# Patient Record
Sex: Female | Born: 1955 | Race: White | Hispanic: No | Marital: Married | Smoking: Former smoker
Health system: Southern US, Community
[De-identification: ages and names within clinical notes are randomized; demographics above are authoritative.]

## PROBLEM LIST (undated history)

## (undated) DIAGNOSIS — M1990 Unspecified osteoarthritis, unspecified site: Secondary | ICD-10-CM

## (undated) DIAGNOSIS — Z972 Presence of dental prosthetic device (complete) (partial): Secondary | ICD-10-CM

## (undated) DIAGNOSIS — I10 Essential (primary) hypertension: Secondary | ICD-10-CM

## (undated) HISTORY — PX: HEMORROIDECTOMY: SUR656

## (undated) HISTORY — PX: REDUCTION MAMMAPLASTY: SUR839

---

## 1996-02-07 HISTORY — PX: BREAST REDUCTION SURGERY: SHX8

## 2003-12-01 ENCOUNTER — Ambulatory Visit: Payer: Self-pay | Admitting: Family Medicine

## 2003-12-08 ENCOUNTER — Ambulatory Visit: Payer: Self-pay | Admitting: Family Medicine

## 2003-12-30 ENCOUNTER — Ambulatory Visit: Payer: Self-pay | Admitting: General Surgery

## 2008-01-16 ENCOUNTER — Ambulatory Visit: Payer: Self-pay | Admitting: Family Medicine

## 2008-01-29 ENCOUNTER — Ambulatory Visit: Payer: Self-pay | Admitting: Family Medicine

## 2008-08-19 ENCOUNTER — Ambulatory Visit: Payer: Self-pay | Admitting: General Surgery

## 2008-09-14 ENCOUNTER — Encounter: Payer: Self-pay | Admitting: Urology

## 2008-10-07 ENCOUNTER — Encounter: Payer: Self-pay | Admitting: Urology

## 2008-11-06 ENCOUNTER — Encounter: Payer: Self-pay | Admitting: Urology

## 2009-02-09 ENCOUNTER — Ambulatory Visit: Payer: Self-pay | Admitting: General Surgery

## 2010-01-22 ENCOUNTER — Other Ambulatory Visit: Payer: Self-pay | Admitting: Family Medicine

## 2010-02-10 ENCOUNTER — Ambulatory Visit: Payer: Self-pay | Admitting: General Surgery

## 2011-03-28 ENCOUNTER — Ambulatory Visit: Payer: Self-pay | Admitting: General Surgery

## 2014-03-23 ENCOUNTER — Emergency Department: Payer: Self-pay | Admitting: Emergency Medicine

## 2014-04-29 DIAGNOSIS — D582 Other hemoglobinopathies: Secondary | ICD-10-CM | POA: Insufficient documentation

## 2014-04-29 DIAGNOSIS — J302 Other seasonal allergic rhinitis: Secondary | ICD-10-CM | POA: Insufficient documentation

## 2014-04-29 DIAGNOSIS — I10 Essential (primary) hypertension: Secondary | ICD-10-CM | POA: Insufficient documentation

## 2014-05-27 DIAGNOSIS — E785 Hyperlipidemia, unspecified: Secondary | ICD-10-CM | POA: Insufficient documentation

## 2014-05-29 ENCOUNTER — Ambulatory Visit
Admit: 2014-05-29 | Disposition: A | Discharge status: Home / Self Care | Payer: Self-pay | Attending: Internal Medicine | Admitting: Internal Medicine

## 2014-07-13 ENCOUNTER — Ambulatory Visit: Payer: No Typology Code available for payment source | Admitting: Anesthesiology

## 2014-07-13 ENCOUNTER — Ambulatory Visit
Admission: RE | Admit: 2014-07-13 | Discharge: 2014-07-13 | Disposition: A | Discharge status: Home / Self Care | Payer: No Typology Code available for payment source | Source: Ambulatory Visit | Attending: Gastroenterology | Admitting: Gastroenterology

## 2014-07-13 ENCOUNTER — Encounter: Payer: Self-pay | Admitting: Anesthesiology

## 2014-07-13 ENCOUNTER — Encounter
Admission: RE | Disposition: A | Discharge status: Home / Self Care | Payer: Self-pay | Source: Ambulatory Visit | Attending: Gastroenterology

## 2014-07-13 DIAGNOSIS — Z79899 Other long term (current) drug therapy: Secondary | ICD-10-CM | POA: Insufficient documentation

## 2014-07-13 DIAGNOSIS — F17210 Nicotine dependence, cigarettes, uncomplicated: Secondary | ICD-10-CM | POA: Insufficient documentation

## 2014-07-13 DIAGNOSIS — Z1211 Encounter for screening for malignant neoplasm of colon: Secondary | ICD-10-CM | POA: Diagnosis present

## 2014-07-13 DIAGNOSIS — E780 Pure hypercholesterolemia: Secondary | ICD-10-CM | POA: Diagnosis not present

## 2014-07-13 DIAGNOSIS — D123 Benign neoplasm of transverse colon: Secondary | ICD-10-CM | POA: Insufficient documentation

## 2014-07-13 DIAGNOSIS — I10 Essential (primary) hypertension: Secondary | ICD-10-CM | POA: Diagnosis not present

## 2014-07-13 DIAGNOSIS — K648 Other hemorrhoids: Secondary | ICD-10-CM | POA: Insufficient documentation

## 2014-07-13 HISTORY — PX: COLONOSCOPY WITH PROPOFOL: SHX5780

## 2014-07-13 SURGERY — COLONOSCOPY WITH PROPOFOL
Anesthesia: General

## 2014-07-13 MED ORDER — PHENYLEPHRINE HCL 10 MG/ML IJ SOLN
INTRAMUSCULAR | Status: DC | PRN
  Administered 2014-07-13 (×2): 100 ug via INTRAVENOUS

## 2014-07-13 MED ORDER — LIDOCAINE HCL (CARDIAC) 20 MG/ML IV SOLN
INTRAVENOUS | Status: DC | PRN
  Administered 2014-07-13: 60 mg via INTRAVENOUS

## 2014-07-13 MED ORDER — PROPOFOL 10 MG/ML IV BOLUS
INTRAVENOUS | Status: DC | PRN
  Administered 2014-07-13: 50 mg via INTRAVENOUS

## 2014-07-13 MED ORDER — PROPOFOL INFUSION 10 MG/ML OPTIME
INTRAVENOUS | Status: DC | PRN
  Administered 2014-07-13: 140 ug/kg/min via INTRAVENOUS

## 2014-07-13 MED ORDER — SODIUM CHLORIDE 0.9 % IV SOLN
INTRAVENOUS | Status: DC
  Administered 2014-07-13: 10:00:00 via INTRAVENOUS

## 2014-07-13 NOTE — Transfer of Care (Signed)
Immediate Anesthesia Transfer of Care Note  Patient: Kim Ritter  Procedure(s) Performed: Procedure(s): COLONOSCOPY WITH PROPOFOL (N/A)  Patient Location: endoscopy unit  Anesthesia Type:General  Level of Consciousness: awake, alert , oriented and patient cooperative  Airway & Oxygen Therapy: Patient Spontanous Breathing and Patient connected to nasal cannula oxygen  Post-op Assessment: Report given to RN, Post -op Vital signs reviewed and stable and Patient moving all extremities X 4  Post vital signs: Reviewed and stable  Last Vitals:  Filed Vitals:   07/13/14 1026  BP: 108/57  Pulse: 71  Temp: 35.6 C  Resp: 10    Complications: No apparent anesthesia complications

## 2014-07-13 NOTE — Op Note (Signed)
[facility] Gastroenterology Patient Name: Kim Ritter Procedure Date: 07/13/2014 9:48 AM MRN: [id number] Account #: [id number] Date of Birth: Dec 06, 1955 Admit Type: Outpatient Age: 59 Room: [facility] ENDO ROOM 2 Gender: Female Note Status: Finalized Procedure:         Colonoscopy Indications:       Screening for colorectal malignant neoplasm, Last                     colonoscopy 10 years ago Patient Profile:   This is a 59 year old female. Providers:         Gerrit Heck. Rayann Heman, MD Referring MD:      Glendon Axe (Referring MD) Medicines:         Propofol per Anesthesia Complications:     No immediate complications. Procedure:         Pre-Anesthesia Assessment:                    - Prior to the procedure, a History and Physical was                     performed, and patient medications and allergies were                     reviewed. The patient is competent. The risks and benefits                     of the procedure and the sedation options and risks were                     discussed with the patient. All questions were answered                     and informed consent was obtained. Patient identification                     and proposed procedure were verified by the physician and                     the nurse in the pre-procedure area. Mental Status                     Examination: alert and oriented. Airway Examination:                     normal oropharyngeal airway and neck mobility. Respiratory                     Examination: clear to auscultation. CV Examination: RRR,                     no murmurs, no S3 or S4. Prophylactic Antibiotics: The                     patient does not require prophylactic antibiotics. Prior                     Anticoagulants: The patient has taken no previous                     anticoagulant or antiplatelet agents. ASA Grade                     Assessment: II - A patient with mild systemic disease.  After  reviewing the risks and benefits, the patient was                     deemed in satisfactory condition to undergo the procedure.                     The anesthesia plan was to use monitored anesthesia care                     (MAC). Immediately prior to administration of medications,                     the patient was re-assessed for adequacy to receive                     sedatives. The heart rate, respiratory rate, oxygen                     saturations, blood pressure, adequacy of pulmonary                     ventilation, and response to care were monitored                     throughout the procedure. The physical status of the                     patient was re-assessed after the procedure.                    - Prior to the procedure, a History and Physical was                     performed, and patient medications, allergies and                     sensitivities were reviewed. The patient's tolerance of                     previous anesthesia was reviewed.                    After obtaining informed consent, the colonoscope was                     passed under direct vision. Throughout the procedure, the                     patient's blood pressure, pulse, and oxygen saturations                     were monitored continuously. The Colonoscope was                     introduced through the anus and advanced to the the cecum,                     identified by appendiceal orifice and ileocecal valve. The                     colonoscopy was performed without difficulty. The patient                     tolerated the procedure well. The quality of the bowel  preparation was excellent. Findings:      The perianal exam findings include non-thrombosed external hemorrhoids.      A 10 mm polyp was found in the proximal transverse colon. The polyp was       flat. The polyp was removed with a hot snare. The polyp was removed with       a saline injection-lift technique using  a hot snare. Resection and       retrieval were complete.      A 6 mm polyp was found in the mid transverse colon. The polyp was flat.       The polyp was removed with a hot snare. Resection and retrieval were       complete.      Four sessile polyps were found in the transverse colon. The polyps were       2 to 3 mm in size. These polyps were removed with a jumbo cold forceps.       Resection and retrieval were complete.      A 6 mm polyp was found in the distal transverse colon. The polyp was       flat. The polyp was removed with a hot snare. Resection and retrieval       were complete.      Internal hemorrhoids were found during retroflexion. The hemorrhoids       were Grade I (internal hemorrhoids that do not prolapse).      No additional abnormalities were found on retroflexion. Impression:        - Non-thrombosed external hemorrhoids found on perianal                     exam.                    - One 10 mm polyp in the proximal transverse colon.                     Resected and retrieved.                    - One 6 mm polyp in the transverse colon. Resected and                     retrieved.                    - Four 2 to 3 mm polyps in the transverse colon. Resected                     and retrieved.                    - One 6 mm polyp in the distal transverse colon. Resected                     and retrieved.                    - Internal hemorrhoids. Recommendation:    - Observe patient in GI recovery unit.                    - High fiber diet.                    - Continue present medications.                    -  Await pathology results.                    - Repeat colonoscopy for surveillance based on pathology                     results.                    - Return to referring physician.                    - The findings and recommendations were discussed with the                     patient.                    - The findings and recommendations were discussed with  the                     patient's family. Procedure Code(s): --- Professional ---                    [phone], 52, Colonoscopy, flexible; with endoscopic mucosal                     resection                    [phone], Colonoscopy, flexible; with removal of tumor(s),                     polyp(s), or other lesion(s) by snare technique                    [id number], 18, Colonoscopy, flexible; with biopsy, single or                     multiple CPT copyright 2014 American Medical Association. All rights reserved. The codes documented in this report are preliminary and upon coder review may  be revised to meet current compliance requirements. Mellody Life, MD 07/13/2014 10:21:48 AM This report has been signed electronically. Number of Addenda: 0 Note Initiated On: 07/13/2014 9:48 AM Scope Withdrawal Time: 0 hours 13 minutes 55 seconds  Total Procedure Duration: 0 hours 22 minutes 28 seconds       [facility]

## 2014-07-13 NOTE — Anesthesia Postprocedure Evaluation (Signed)
  Anesthesia Post-op Note  Patient: Kim Ritter  Procedure(s) Performed: Procedure(s): COLONOSCOPY WITH PROPOFOL (N/A)  Anesthesia type:General  Patient location: PACU  Post pain: Pain level controlled  Post assessment: Post-op Vital signs reviewed, Patient's Cardiovascular Status Stable, Respiratory Function Stable, Patent Airway and No signs of Nausea or vomiting  Post vital signs: Reviewed and stable  Last Vitals:  Filed Vitals:   07/13/14 1100  BP: 157/91  Pulse: 71  Temp:   Resp: 15    Level of consciousness: awake, alert  and patient cooperative  Complications: No apparent anesthesia complications

## 2014-07-13 NOTE — Discharge Instructions (Signed)

## 2014-07-13 NOTE — Anesthesia Preprocedure Evaluation (Signed)
Anesthesia Evaluation  Patient identified by MRN, date of birth, ID band Patient awake    Reviewed: Allergy & Precautions, Patient's Chart, lab work & pertinent test results  Airway Mallampati: II  TM Distance: >3 FB     Dental  (+) Chipped   Pulmonary          Cardiovascular     Neuro/Psych    GI/Hepatic   Endo/Other    Renal/GU      Musculoskeletal   Abdominal   Peds  Hematology   Anesthesia Other Findings   Reproductive/Obstetrics                             Anesthesia Physical Anesthesia Plan  ASA: II  Anesthesia Plan:    Post-op Pain Management:    Induction: Intravenous  Airway Management Planned: Nasal Cannula  Additional Equipment:   Intra-op Plan:   Post-operative Plan:   Informed Consent: I have reviewed the patients History and Physical, chart, labs and discussed the procedure including the risks, benefits and alternatives for the proposed anesthesia with the patient or authorized representative who has indicated his/her understanding and acceptance.     Plan Discussed with: CRNA  Anesthesia Plan Comments:         Anesthesia Quick Evaluation

## 2014-07-13 NOTE — H&P (Signed)
  Primary Care Physician:  Glendon Axe, MD  Pre-Procedure History & Physical: HPI:  Kim Ritter is a 59 y.o. female is here for an colonoscopy.   History reviewed. No pertinent past medical history.  History reviewed. No pertinent past surgical history.  Prior to Admission medications   Medication Sig Start Date End Date Taking? Authorizing Provider  amLODipine (NORVASC) 5 MG tablet Take 5 mg by mouth daily.   Yes Historical Provider, MD  Multiple Vitamin (MULTIVITAMIN WITH MINERALS) TABS tablet Take 1 tablet by mouth daily.    Historical Provider, MD    Allergies as of 06/15/2014  . (Not on File)    History reviewed. No pertinent family history.  History   Social History  . Marital Status: Married    Spouse Name: N/A  . Number of Children: N/A  . Years of Education: N/A   Occupational History  . Not on file.   Social History Main Topics  . Smoking status: Not on file  . Smokeless tobacco: Not on file  . Alcohol Use: Not on file  . Drug Use: Not on file  . Sexual Activity: Not on file   Other Topics Concern  . Not on file   Social History Narrative  . No narrative on file     Physical Exam: BP 150/79 mmHg  Pulse 73  Temp(Src) 97.1 F (36.2 C) (Tympanic)  Resp 20  Ht 5\' 2"  (1.575 m)  Wt 168 lb (76.204 kg)  BMI 30.72 kg/m2  SpO2 99% General:   Alert,  pleasant and cooperative in NAD Head:  Normocephalic and atraumatic. Neck:  Supple; no masses or thyromegaly. Lungs:  Clear throughout to auscultation.    Heart:  Regular rate and rhythm. Abdomen:  Soft, nontender and nondistended. Normal bowel sounds, without guarding, and without rebound.   Neurologic:  Alert and  oriented x4;  grossly normal neurologically.  Impression/Plan: Kim Ritter is here for an colonoscopy to be performed for screening, average risk  Risks, benefits, limitations, and alternatives regarding  colonoscopy have been reviewed with the patient.  Questions have been  answered.  All parties agreeable.   Josefine Class, MD  07/13/2014, 9:44 AM

## 2014-07-14 LAB — SURGICAL PATHOLOGY

## 2014-07-16 ENCOUNTER — Encounter: Payer: Self-pay | Admitting: Gastroenterology

## 2016-04-14 ENCOUNTER — Telehealth: Payer: Self-pay

## 2016-04-14 NOTE — Telephone Encounter (Signed)
Do you know this patient and why she left? Seems to have left before Dr. Venia Minks left.

## 2016-04-14 NOTE — Telephone Encounter (Signed)
Patient called and wanted to see if she could re establish with our practice she use to see Dr Venia Minks LOV 12/2011 and then she went to [facility] and was seen Dr Candiss Norse but states she just does not like [facility] and wanted to switch back. No major issues per patient. HTN. Would like to get a CPE. Are you willing to see her? She is aware that new MD is coming in August and could transfer over to her at that time if need to. -aa

## 2016-08-04 ENCOUNTER — Telehealth: Payer: Self-pay | Admitting: Physician Assistant

## 2016-08-04 NOTE — Telephone Encounter (Signed)
Patient came in asking if we were going let her re-establish care here.  She has been taking of her parents who are battling cancer and has not been taking care of herself.  She has not seen other providers.    But she recently lost her mom and wants to start coming back for medical care.

## 2016-09-15 ENCOUNTER — Ambulatory Visit (INDEPENDENT_AMBULATORY_CARE_PROVIDER_SITE_OTHER): Payer: Managed Care, Other (non HMO) | Admitting: Family Medicine

## 2016-09-15 ENCOUNTER — Encounter: Payer: Self-pay | Admitting: Family Medicine

## 2016-09-15 VITALS — BP 192/98 | HR 80 | Temp 98.2°F | Resp 16 | Ht 62.0 in | Wt 167.0 lb

## 2016-09-15 DIAGNOSIS — Z1231 Encounter for screening mammogram for malignant neoplasm of breast: Secondary | ICD-10-CM

## 2016-09-15 DIAGNOSIS — E669 Obesity, unspecified: Secondary | ICD-10-CM | POA: Insufficient documentation

## 2016-09-15 DIAGNOSIS — Z0000 Encounter for general adult medical examination without abnormal findings: Secondary | ICD-10-CM | POA: Insufficient documentation

## 2016-09-15 DIAGNOSIS — Z6830 Body mass index (BMI) 30.0-30.9, adult: Secondary | ICD-10-CM | POA: Diagnosis not present

## 2016-09-15 DIAGNOSIS — I10 Essential (primary) hypertension: Secondary | ICD-10-CM | POA: Insufficient documentation

## 2016-09-15 DIAGNOSIS — Z114 Encounter for screening for human immunodeficiency virus [HIV]: Secondary | ICD-10-CM | POA: Diagnosis not present

## 2016-09-15 DIAGNOSIS — Z124 Encounter for screening for malignant neoplasm of cervix: Secondary | ICD-10-CM

## 2016-09-15 DIAGNOSIS — Z1159 Encounter for screening for other viral diseases: Secondary | ICD-10-CM

## 2016-09-15 MED ORDER — AMLODIPINE BESYLATE 5 MG PO TABS: 5.0000 mg | ORAL_TABLET | Freq: Every day | ORAL | 1 refills | Status: DC

## 2016-09-15 NOTE — Assessment & Plan Note (Signed)
Discussed diet and exercise Screening lipid panel and A1c

## 2016-09-15 NOTE — Assessment & Plan Note (Signed)
Poorly controlled, but out of medication x1 month No red flags Resume Amlodipine Check CMP, screening lipid panel, and A1c F/u in 2wks Symptoms to seek emergency care for discussed

## 2016-09-15 NOTE — Patient Instructions (Signed)

## 2016-09-15 NOTE — Progress Notes (Signed)
Patient: Kim Ritter, Female    DOB: 01/29/1956, 61 y.o.   MRN: [id number] Visit Date: 09/15/2016  Today's Provider: Lavon Paganini, MD   Chief Complaint  Patient presents with  . Annual Exam  . Hypertension   Subjective:    Annual physical exam Kim Ritter is a 61 y.o. female who presents today for health maintenance and complete physical. She feels fairly well. Her mother passed away in 08-14-2022 of this year. She reports she is not regularly exercising, but she is active at work. She reports she is sleeping fairly well.   Last CPE- 01/17/2010 (Last office visit was 12/12/2011) Last colonoscopy- 07/13/2014- internal hemorrhoids, non-thrombosed external hemorrhoids. Sessile serrated adenoma and hyperplastic polyps.  Last mammogram- 05/29/2014- BI-RADS 1 Last pap- 01/17/2010- Negative. HPV negative. No h/o abnormal pap smears -----------------------------------------------------------------  Hypertension, follow-up:  BP Readings from Last 3 Encounters:  09/15/16 (!) 192/98  07/13/14 (!) 157/91    Pt was started on Amlodipine in 2016 at that the hospital. She was on Lisinopril-HCTZ, but it caused her to feel like "I had an elephant sitting on my chest". She reports fair compliance with treatment. She has been out of her Amlodipine for about 1 month. She is not having side effects.  She is not exercising. She is mostly adherent to low salt diet.   Outside blood pressures are 160-170/84-95. She is experiencing none.  Patient denies chest pain, chest pressure/discomfort, claudication, dyspnea, exertional chest pressure/discomfort, fatigue, irregular heart beat, lower extremity edema, near-syncope, orthopnea, palpitations and syncope.   Cardiovascular risk factors include hypertension and smoking/ tobacco exposure.  Use of agents associated with hypertension: none.     Weight trend: stable Wt Readings from Last 3 Encounters:  09/15/16 167 lb (75.8 kg)  07/13/14  168 lb (76.2 kg)    Current diet: in general, a "healthy" diet    ------------------------------------------------------------------------    Review of Systems  Constitutional: Negative.   HENT: Negative.   Eyes: Positive for redness. Negative for photophobia, pain, discharge, itching and visual disturbance.  Respiratory: Negative.   Cardiovascular: Negative.   Gastrointestinal: Negative.   Endocrine: Positive for polyuria. Negative for cold intolerance, heat intolerance, polydipsia and polyphagia.  Genitourinary: Positive for frequency. Negative for decreased urine volume, difficulty urinating, dyspareunia, dysuria, enuresis, flank pain, genital sores, hematuria, menstrual problem, pelvic pain, urgency, vaginal bleeding, vaginal discharge and vaginal pain.  Musculoskeletal: Negative.   Skin: Negative.   Allergic/Immunologic: Negative.   Neurological: Negative.   Hematological: Negative.   Psychiatric/Behavioral: Negative.     Social History      She  reports that she has been smoking Cigarettes.  She has a 25.00 pack-year smoking history. She has never used smokeless tobacco. She reports that she does not drink alcohol or use drugs.       Social History   Social History  . Marital status: Married    Spouse name: Legrand Como  . Number of children: 2  . Years of education: HS   Occupational History  .  Self Employed   Social History Main Topics  . Smoking status: Current Every Day Smoker    Packs/day: 1.00    Years: 25.00    Types: Cigarettes  . Smokeless tobacco: Never Used  . Alcohol use No  . Drug use: No  . Sexual activity: Yes    Birth control/ protection: Post-menopausal   Other Topics Concern  . None   Social History Narrative  . None    History  reviewed. No pertinent past medical history.   Patient Active Problem List   Diagnosis Date Noted  . Healthcare maintenance 09/15/2016  . Obesity 09/15/2016  . HTN (hypertension) 09/15/2016    Past  Surgical History:  Procedure Laterality Date  . BREAST REDUCTION SURGERY Bilateral 1998  . COLONOSCOPY WITH PROPOFOL N/A 07/13/2014   Procedure: COLONOSCOPY WITH PROPOFOL;  Surgeon: Josefine Class, MD;  Location: [facility] ENDOSCOPY;  Service: Endoscopy;  Laterality: N/A;  . HEMORROIDECTOMY      Family History        Family Status  Relation Status  . Mother Deceased  . Father Other       UNKNOWN        Her family history includes Congestive Heart Failure in her mother; Kidney failure in her mother; Melanoma in her mother.     No Known Allergies   Current Outpatient Prescriptions:  Marland Kitchen  Multiple Vitamin (MULTIVITAMIN WITH MINERALS) TABS tablet, Take 1 tablet by mouth daily., Disp: , Rfl:  .  amLODipine (NORVASC) 5 MG tablet, Take 1 tablet (5 mg total) by mouth daily., Disp: 30 tablet, Rfl: 1   Patient Care Team: Virginia Crews, MD as PCP - General (Family Medicine)      Objective:   Vitals: BP (!) 192/98 (BP Location: Left Arm, Patient Position: Sitting, Cuff Size: Normal)   Pulse 80   Temp 98.2 F (36.8 C) (Oral)   Resp 16   Ht 5\' 2"  (1.575 m)   Wt 167 lb (75.8 kg)   BMI 30.54 kg/m    Vitals:   09/15/16 1426  BP: (!) 192/98  Pulse: 80  Resp: 16  Temp: 98.2 F (36.8 C)  TempSrc: Oral  Weight: 167 lb (75.8 kg)  Height: 5\' 2"  (1.575 m)     Physical Exam  Constitutional: She is oriented to person, place, and time. She appears well-developed and well-nourished. No distress.  HENT:  Head: Normocephalic and atraumatic.  Right Ear: External ear normal.  Left Ear: External ear normal.  Mouth/Throat: Oropharynx is clear and moist. No oropharyngeal exudate.  Eyes: Pupils are equal, round, and reactive to light. Conjunctivae and EOM are normal. No scleral icterus.  Neck: Neck supple. No JVD present.  Cardiovascular: Normal rate, regular rhythm, normal heart sounds and intact distal pulses.   No murmur heard. Pulmonary/Chest: Effort normal and breath sounds  normal. No respiratory distress. She has no wheezes. She has no rales.  Abdominal: Soft. Bowel sounds are normal. She exhibits no distension. There is no tenderness. There is no rebound and no guarding.  Genitourinary:  Genitourinary Comments: Breasts: breasts appear normal, no suspicious masses, no skin or nipple changes or axillary nodes, surgical scars noted from previous breast reduction. GYN:  External genitalia within normal limits.  Vaginal mucosa pink, moist, normal rugae.  Nonfriable cervix without lesions, no discharge or bleeding noted on speculum exam.  Bimanual exam revealed normal, nongravid uterus.  No cervical motion tenderness. No adnexal masses bilaterally.     Musculoskeletal: She exhibits no edema or deformity.  Lymphadenopathy:    She has no cervical adenopathy.  Neurological: She is alert and oriented to person, place, and time. No cranial nerve deficit.  Skin: Skin is warm and dry. No rash noted.  Psychiatric: She has a normal mood and affect. Her behavior is normal. Thought content normal.  Vitals reviewed.    Depression Screen PHQ 2/9 Scores 09/15/2016  PHQ - 2 Score 2      Assessment & Plan:  Routine Health Maintenance and Physical Exam  Exercise Activities and Dietary recommendations Goals    None       There is no immunization history on file for this patient.  Health Maintenance  Topic Date Due  . Hepatitis C Screening  1955/04/12  . HIV Screening  11/30/1969  . TETANUS/TDAP  11/30/1973  . PAP SMEAR  12/01/1975  . MAMMOGRAM  05/28/2016  . INFLUENZA VACCINE  09/06/2016  . COLONOSCOPY  07/12/2024     Discussed health benefits of physical activity, and encouraged her to engage in regular exercise appropriate for her age and condition.   HTN (hypertension) Poorly controlled, but out of medication x1 month No red flags Resume Amlodipine Check CMP, screening lipid panel, and A1c F/u in 2wks Symptoms to seek emergency care for  discussed  Obesity Discussed diet and exercise Screening lipid panel and A1c  Healthcare maintenance Breast exam done today Pap smear collected UTD on colon cancer screening Screening Hep C and HIV Next CPE in 1 yr    --------------------------------------------------------------------  The entirety of the information documented in the History of Present Illness, Review of Systems and Physical Exam were personally obtained by me. Portions of this information were initially documented by Raquel Sarna Ratchford, CMA and reviewed by me for thoroughness and accuracy.    Lavon Paganini, MD  [facility]

## 2016-09-15 NOTE — Assessment & Plan Note (Addendum)
Breast exam done today Pap smear collected UTD on colon cancer screening Screening Hep C and HIV Next CPE in 1 yr

## 2016-09-18 ENCOUNTER — Encounter: Payer: Self-pay | Admitting: Family Medicine

## 2016-09-18 ENCOUNTER — Telehealth: Payer: Self-pay | Admitting: Family Medicine

## 2016-09-18 LAB — PAP IG AND HPV HIGH-RISK
HPV, HIGH-RISK: NEGATIVE
PAP SMEAR COMMENT: 0

## 2016-09-18 NOTE — Telephone Encounter (Signed)
Pt is returning call.  GB#[phone]/YE

## 2016-09-19 NOTE — Telephone Encounter (Signed)
Pt advised of lab results. See results in charts.

## 2016-09-21 ENCOUNTER — Encounter: Payer: Self-pay | Admitting: Family Medicine

## 2016-09-25 ENCOUNTER — Encounter: Payer: Self-pay | Admitting: Family Medicine

## 2016-09-26 ENCOUNTER — Telehealth: Payer: Self-pay

## 2016-09-26 LAB — HIV ANTIBODY (ROUTINE TESTING W REFLEX): HIV Screen 4th Generation wRfx: NONREACTIVE

## 2016-09-26 LAB — COMPREHENSIVE METABOLIC PANEL
ALT: 23 IU/L (ref 0–32)
AST: 23 IU/L (ref 0–40)
Albumin/Globulin Ratio: 1.6 (ref 1.2–2.2)
Albumin: 4.5 g/dL (ref 3.6–4.8)
Alkaline Phosphatase: 106 IU/L (ref 39–117)
BUN / CREAT RATIO: 12 (ref 12–28)
BUN: 9 mg/dL (ref 8–27)
Bilirubin Total: 0.3 mg/dL (ref 0.0–1.2)
CO2: 24 mmol/L (ref 20–29)
CREATININE: 0.75 mg/dL (ref 0.57–1.00)
Calcium: 9.7 mg/dL (ref 8.7–10.3)
Chloride: 101 mmol/L (ref 96–106)
GFR, EST AFRICAN AMERICAN: 99 mL/min/{1.73_m2} (ref >59)
GFR, EST NON AFRICAN AMERICAN: 86 mL/min/{1.73_m2} (ref >59)
GLOBULIN, TOTAL: 2.8 g/dL (ref 1.5–4.5)
Glucose: 114 mg/dL — ABNORMAL HIGH (ref 65–99)
Potassium: 4.1 mmol/L (ref 3.5–5.2)
Sodium: 148 mmol/L — ABNORMAL HIGH (ref 134–144)
TOTAL PROTEIN: 7.3 g/dL (ref 6.0–8.5)

## 2016-09-26 LAB — LIPID PANEL
CHOLESTEROL TOTAL: 252 mg/dL — AB (ref 100–199)
Chol/HDL Ratio: 6.5 ratio — ABNORMAL HIGH (ref 0.0–4.4)
HDL: 39 mg/dL — AB (ref >39)
LDL Calculated: 163 mg/dL — ABNORMAL HIGH (ref 0–99)
Triglycerides: 248 mg/dL — ABNORMAL HIGH (ref 0–149)
VLDL CHOLESTEROL CAL: 50 mg/dL — AB (ref 5–40)

## 2016-09-26 LAB — HEPATITIS C ANTIBODY: Hep C Virus Ab: 0.1 s/co ratio (ref 0.0–0.9)

## 2016-09-26 LAB — HEMOGLOBIN A1C
Est. average glucose Bld gHb Est-mCnc: 117 mg/dL
Hgb A1c MFr Bld: 5.7 % — ABNORMAL HIGH (ref 4.8–5.6)

## 2016-09-26 MED ORDER — ATORVASTATIN CALCIUM 40 MG PO TABS: 40.0000 mg | ORAL_TABLET | Freq: Every day | ORAL | 3 refills | Status: DC

## 2016-09-26 NOTE — Telephone Encounter (Signed)
Pt advised, and she verbalizes understanding of tx plan. She does agree to start statin. [facility] [address].

## 2016-09-26 NOTE — Telephone Encounter (Signed)
Lipitor 40mg  daily Rx sent to pharmacy.  Virginia Crews, MD, MPH [facility] 09/26/2016 11:41 AM

## 2016-09-26 NOTE — Telephone Encounter (Signed)
-----   Message from Virginia Crews, MD sent at 09/26/2016  8:34 AM EDT ----- Cholesterol is quite high.  Even if blood pressure was normal, 10 yr risk of heart disease or stroke is still 15.7%.  We should seriously consider a statin medication to lower cholesterol.  A1c also shows pre-diabetes.  This means that carbohydrate intake should be limited so this does not progress to diabetes.  Electrolytes, kidney function, and liver function all normal, except sodium is running a little high.  Make sure to follow a low sodium diet and drink plenty of water.  HIV and Hep C screening both negative.  Virginia Crews, MD, MPH [facility] 09/26/2016 8:33 AM

## 2016-09-29 ENCOUNTER — Encounter: Payer: Self-pay | Admitting: Family Medicine

## 2016-09-29 ENCOUNTER — Ambulatory Visit (INDEPENDENT_AMBULATORY_CARE_PROVIDER_SITE_OTHER): Payer: Managed Care, Other (non HMO) | Admitting: Family Medicine

## 2016-09-29 VITALS — BP 128/80 | HR 88 | Temp 98.1°F | Resp 16 | Wt 169.0 lb

## 2016-09-29 DIAGNOSIS — I10 Essential (primary) hypertension: Secondary | ICD-10-CM | POA: Diagnosis not present

## 2016-09-29 DIAGNOSIS — E785 Hyperlipidemia, unspecified: Secondary | ICD-10-CM | POA: Insufficient documentation

## 2016-09-29 DIAGNOSIS — Z87891 Personal history of nicotine dependence: Secondary | ICD-10-CM | POA: Insufficient documentation

## 2016-09-29 DIAGNOSIS — Z720 Tobacco use: Secondary | ICD-10-CM

## 2016-09-29 DIAGNOSIS — E782 Mixed hyperlipidemia: Secondary | ICD-10-CM | POA: Diagnosis not present

## 2016-09-29 DIAGNOSIS — E669 Obesity, unspecified: Secondary | ICD-10-CM

## 2016-09-29 DIAGNOSIS — Z6830 Body mass index (BMI) 30.0-30.9, adult: Secondary | ICD-10-CM | POA: Diagnosis not present

## 2016-09-29 NOTE — Patient Instructions (Addendum)
[id number]-[phone]   Dyslipidemia Dyslipidemia is an imbalance of waxy, fat-like substances (lipids) in the blood. The body needs lipids in small amounts. Dyslipidemia often involves a high level of cholesterol or triglycerides, which are types of lipids. Common forms of dyslipidemia include:  High levels of bad cholesterol (LDL cholesterol). LDL is the type of cholesterol that causes fatty deposits (plaques) to build up in the blood vessels that carry blood away from your heart (arteries).  Low levels of good cholesterol (HDL cholesterol). HDL cholesterol is the type of cholesterol that protects against heart disease. High levels of HDL remove the LDL buildup from arteries.  High levels of triglycerides. Triglycerides are a fatty substance in the blood that is linked to a buildup of plaques in the arteries.  You can develop dyslipidemia because of the genes you are born with (primary dyslipidemia) or changes that occur during your life (secondary dyslipidemia), or as a side effect of certain medical treatments. What are the causes? Primary dyslipidemia is caused by changes (mutations) in genes that are passed down through families (inherited). These mutations cause several types of dyslipidemia. Mutations can result in disorders that make the body produce too much LDL cholesterol or triglycerides, or not enough HDL cholesterol. These disorders may lead to heart disease, arterial disease, or stroke at an early age. Causes of secondary dyslipidemia include certain lifestyle choices and diseases that lead to dyslipidemia, such as:  Eating a diet that is high in animal fat.  Not getting enough activity or exercise (having a sedentary lifestyle).  Having diabetes, kidney disease, liver disease, or thyroid disease.  Drinking large amounts of alcohol.  Using certain types of drugs.  What increases the risk? You may be at greater risk for dyslipidemia if you are an older man or if you are a woman  who has gone through menopause. Other risk factors include:  Having a family history of dyslipidemia.  Taking certain medicines, including birth control pills, steroids, some diuretics, beta-blockers, and some medicines forHIV.  Smoking cigarettes.  Eating a high-fat diet.  Drinking large amounts of alcohol.  Having certain medical conditions such as diabetes, polycystic ovary syndrome (PCOS), pregnancy, kidney disease, liver disease, or hypothyroidism.  Not exercising regularly.  Being overweight or obese with too much belly fat.  What are the signs or symptoms? Dyslipidemia does not usually cause any symptoms. Very high lipid levels can cause fatty bumps under the skin (xanthomas) or a white or gray ring around the black center (pupil) of the eye. Very high triglyceride levels can cause inflammation of the pancreas (pancreatitis). How is this diagnosed? Your health care provider may diagnose dyslipidemia based on a routine blood test (fasting blood test). Because most people do not have symptoms of the condition, this blood testing (lipid profile) is done on adults age 62 and older and is repeated every 5 years. This test checks:  Total cholesterol. This is a measure of the total amount of cholesterol in your blood, including LDL cholesterol, HDL cholesterol, and triglycerides. A healthy number is below 200.  LDL cholesterol. The target number for LDL cholesterol is different for each person, depending on individual risk factors. For most people, a number below 100 is healthy. Ask your health care provider what your LDL cholesterol number should be.  HDL cholesterol. An HDL level of 60 or higher is best because it helps to protect against heart disease. A number below 62 for men or below 59 for women increases the risk for heart disease.  Triglycerides. A healthy triglyceride number is below 150.  If your lipid profile is abnormal, your health care provider may do other blood tests  to get more information about your condition. How is this treated? Treatment depends on the type of dyslipidemia that you have and your other risk factors for heart disease and stroke. Your health care provider will have a target range for your lipid levels based on this information. For many people, treatment starts with lifestyle changes, such as diet and exercise. Your health care provider may recommend that you:  Get regular exercise.  Make changes to your diet.  Quit smoking if you smoke.  If diet changes and exercise do not help you reach your goals, your health care provider may also prescribe medicine to lower lipids. The most commonly prescribed type of medicine lowers your LDL cholesterol (statin drug). If you have a high triglyceride level, your provider may prescribe another type of drug (fibrate) or an omega-3 fish oil supplement, or both. Follow these instructions at home:  Take over-the-counter and prescription medicines only as told by your health care provider. This includes supplements.  Get regular exercise. Start an aerobic exercise and strength training program as told by your health care provider. Ask your health care provider what activities are safe for you. Your health care provider may recommend: ? 30 minutes of aerobic activity 4-6 days a week. Brisk walking is an example of aerobic activity. ? Strength training 2 days a week.  Eat a healthy diet as told by your health care provider. This can help you reach and maintain a healthy weight, lower your LDL cholesterol, and raise your HDL cholesterol. It may help to work with a diet and nutrition specialist (dietitian) to make a plan that is right for you. Your dietitian or health care provider may recommend: ? Limiting your calories, if you are overweight. ? Eating more fruits, vegetables, whole grains, fish, and lean meats. ? Limiting saturated fat, trans fat, and cholesterol.  Follow instructions from your health  care provider or dietitian about eating or drinking restrictions.  Limit alcohol intake to no more than one drink per day for nonpregnant women and two drinks per day for men. One drink equals 12 oz of beer, 5 oz of wine, or 1 oz of hard liquor.  Do not use any products that contain nicotine or tobacco, such as cigarettes and e-cigarettes. If you need help quitting, ask your health care provider.  Keep all follow-up visits as told by your health care provider. This is important. Contact a health care provider if:  You are having trouble sticking to your exercise or diet plan.  You are struggling to quit smoking or control your use of alcohol. Summary  Dyslipidemia is an imbalance of waxy, fat-like substances (lipids) in the blood. The body needs lipids in small amounts. Dyslipidemia often involves a high level of cholesterol or triglycerides, which are types of lipids.  Treatment depends on the type of dyslipidemia that you have and your other risk factors for heart disease and stroke.  For many people, treatment starts with lifestyle changes, such as diet and exercise. Your health care provider may also prescribe medicine to lower lipids. This information is not intended to replace advice given to you by your health care provider. Make sure you discuss any questions you have with your health care provider. Document Released: 01/28/2013 Document Revised: 09/20/2015 Document Reviewed: 09/20/2015 Elsevier Interactive Patient Education  [employer].

## 2016-09-29 NOTE — Assessment & Plan Note (Signed)
Doing well after starting Lipitor after last visit No side effects Discussed diet and exercise changes Follow-up in 6 months and recheck direct LDL at that time

## 2016-09-29 NOTE — Assessment & Plan Note (Signed)
Well controlled Continue Amlodipine 5mg  daily F/u in 6 months

## 2016-09-29 NOTE — Assessment & Plan Note (Signed)
Discussed importance of tobacco cessation and strategies for quitting Quit line information given to patient Patient declined Wellbutrin and Chantix She will try patches and gum Follow-up in 6 months

## 2016-09-29 NOTE — Assessment & Plan Note (Signed)
Discussed diet and exercise 

## 2016-09-29 NOTE — Progress Notes (Signed)
Patient: Kim Ritter Female    DOB: 11/27/1955   61 y.o.   MRN: [id number] Visit Date: 09/29/2016  Today's Provider: Lavon Paganini, MD   Chief Complaint  Patient presents with  . Hypertension  . Hyperlipidemia   Subjective:    HPI      Hypertension, follow-up:  BP Readings from Last 3 Encounters:  09/29/16 128/80  09/15/16 (!) 192/98  07/13/14 (!) 157/91    She was last seen for hypertension 2 weeks ago.  BP at that visit was 192/98. Management since that visit includes resuming Amlodipine 5 mg po qd. (Pt had been off of this medication x 1 month.) She reports good compliance with treatment. She is not having side effects.  She is exercising. 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 She is adherent to low salt diet.   Outside blood pressures are ranging from 160-199/90's. She is experiencing fatigue.  Patient denies chest pain, chest pressure/discomfort, claudication, dyspnea, exertional chest pressure/discomfort, irregular heart beat, lower extremity edema, near-syncope, orthopnea, palpitations and syncope.   Cardiovascular risk factors include dyslipidemia, hypertension and smoking/ tobacco exposure.  Use of agents associated with hypertension: none.     Weight trend: fluctuating a bit Wt Readings from Last 3 Encounters:  09/29/16 169 lb (76.7 kg)  09/15/16 167 lb (75.8 kg)  07/13/14 168 lb (76.2 kg)    Current diet: improving per pt. She has cut back on her diet coke and sweet tea. She is now drinking cranberry juice and water. She is also eating healthier.  ------------------------------------------------------------------------   Lipid/Cholesterol, Follow-up:   Last seen for this 2 weeks ago.  Management changes since that visit include starting Atorvastatin 40 mg po qd. . Last Lipid Panel:    Component Value Date/Time   CHOL 252 (H) 09/25/2016 1223   TRIG 248 (H) 09/25/2016 1223   HDL 39 (L) 09/25/2016 1223   CHOLHDL 6.5 (H)  09/25/2016 1223   LDLCALC 163 (H) 09/25/2016 1223    Risk factors for vascular disease include hypercholesterolemia, hypertension and smoking  She reports good compliance with treatment. She is not having side effects.    -------------------------------------------------------------------   No Known Allergies   Current Outpatient Prescriptions:  .  amLODipine (NORVASC) 5 MG tablet, Take 1 tablet (5 mg total) by mouth daily., Disp: 30 tablet, Rfl: 1 .  atorvastatin (LIPITOR) 40 MG tablet, Take 1 tablet (40 mg total) by mouth daily., Disp: 90 tablet, Rfl: 3 .  Multiple Vitamin (MULTIVITAMIN WITH MINERALS) TABS tablet, Take 1 tablet by mouth daily., Disp: , Rfl:   Review of Systems  Constitutional: Positive for fatigue. Negative for fever.  HENT: Negative.   Respiratory: Negative for apnea, cough and shortness of breath.   Cardiovascular: Negative for chest pain, palpitations and leg swelling.  Gastrointestinal: Negative.   Endocrine: Negative.   Genitourinary: Negative.   Musculoskeletal: Negative.   Neurological: Negative.     Social History  Substance Use Topics  . Smoking status: Current Every Day Smoker    Packs/day: 1.00    Years: 25.00    Types: Cigarettes  . Smokeless tobacco: Never Used  . Alcohol use No   Objective:   BP 128/80 (BP Location: Left Arm, Patient Position: Sitting, Cuff Size: Normal)   Pulse 88   Temp 98.1 F (36.7 C) (Oral)   Resp 16   Wt 169 lb (76.7 kg)   BMI 30.91 kg/m  Vitals:   09/29/16 1453  BP: 128/80  Pulse: 88  Resp: 16  Temp: 98.1 F (36.7 C)  TempSrc: Oral  Weight: 169 lb (76.7 kg)     Physical Exam  Constitutional: She is oriented to person, place, and time. She appears well-developed and well-nourished. No distress.  HENT:  Head: Normocephalic and atraumatic.  Right Ear: External ear normal.  Left Ear: External ear normal.  Nose: Nose normal.  Mouth/Throat: Oropharynx is clear and moist.  Eyes: Conjunctivae are  normal. No scleral icterus.  Neck: Neck supple.  Cardiovascular: Normal rate, regular rhythm, normal heart sounds and intact distal pulses.   No murmur heard. Pulmonary/Chest: Effort normal and breath sounds normal. No respiratory distress. She has no wheezes. She has no rales.  Musculoskeletal: She exhibits no edema or deformity.  Lymphadenopathy:    She has no cervical adenopathy.  Neurological: She is alert and oriented to person, place, and time.  Skin: Skin is warm and dry. No rash noted.  Psychiatric: She has a normal mood and affect. Her behavior is normal.  Vitals reviewed.       Assessment & Plan:     HTN (hypertension) Well controlled Continue Amlodipine 5mg  daily F/u in 6 months  Obesity Discussed diet and exercise  HLD (hyperlipidemia) Doing well after starting Lipitor after last visit No side effects Discussed diet and exercise changes Follow-up in 6 months and recheck direct LDL at that time  Tobacco abuse Discussed importance of tobacco cessation and strategies for quitting Quit line information given to patient Patient declined Wellbutrin and Chantix She will try patches and gum Follow-up in 6 months      The entirety of the information documented in the History of Present Illness, Review of Systems and Physical Exam were personally obtained by me. Portions of this information were initially documented by Raquel Sarna Ratchford, CMA and reviewed by me for thoroughness and accuracy.    Lavon Paganini, MD  [facility]

## 2016-10-06 ENCOUNTER — Ambulatory Visit
Admission: RE | Admit: 2016-10-06 | Discharge: 2016-10-06 | Disposition: A | Discharge status: Home / Self Care | Payer: Managed Care, Other (non HMO) | Source: Ambulatory Visit | Attending: Family Medicine | Admitting: Family Medicine

## 2016-10-06 DIAGNOSIS — Z1231 Encounter for screening mammogram for malignant neoplasm of breast: Secondary | ICD-10-CM | POA: Insufficient documentation

## 2016-10-06 DIAGNOSIS — R928 Other abnormal and inconclusive findings on diagnostic imaging of breast: Secondary | ICD-10-CM | POA: Insufficient documentation

## 2016-10-11 ENCOUNTER — Other Ambulatory Visit: Payer: Self-pay | Admitting: Family Medicine

## 2016-10-11 DIAGNOSIS — N6320 Unspecified lump in the left breast, unspecified quadrant: Secondary | ICD-10-CM

## 2016-10-11 DIAGNOSIS — N6310 Unspecified lump in the right breast, unspecified quadrant: Secondary | ICD-10-CM

## 2016-10-11 DIAGNOSIS — R928 Other abnormal and inconclusive findings on diagnostic imaging of breast: Secondary | ICD-10-CM

## 2016-10-11 DIAGNOSIS — R590 Localized enlarged lymph nodes: Secondary | ICD-10-CM

## 2016-10-16 ENCOUNTER — Ambulatory Visit
Admission: RE | Admit: 2016-10-16 | Discharge: 2016-10-16 | Disposition: A | Discharge status: Home / Self Care | Payer: Managed Care, Other (non HMO) | Source: Ambulatory Visit | Attending: Family Medicine | Admitting: Family Medicine

## 2016-10-16 ENCOUNTER — Telehealth: Payer: Self-pay

## 2016-10-16 DIAGNOSIS — N6310 Unspecified lump in the right breast, unspecified quadrant: Secondary | ICD-10-CM | POA: Diagnosis present

## 2016-10-16 DIAGNOSIS — R928 Other abnormal and inconclusive findings on diagnostic imaging of breast: Secondary | ICD-10-CM

## 2016-10-16 DIAGNOSIS — R590 Localized enlarged lymph nodes: Secondary | ICD-10-CM | POA: Diagnosis present

## 2016-10-16 DIAGNOSIS — N6001 Solitary cyst of right breast: Secondary | ICD-10-CM | POA: Insufficient documentation

## 2016-10-16 DIAGNOSIS — L723 Sebaceous cyst: Secondary | ICD-10-CM | POA: Insufficient documentation

## 2016-10-16 DIAGNOSIS — N6320 Unspecified lump in the left breast, unspecified quadrant: Secondary | ICD-10-CM

## 2016-10-16 NOTE — Telephone Encounter (Signed)
-----   Message from Virginia Crews, MD sent at 10/16/2016  2:41 PM EDT ----- Normal breast ultrasounds.  There are some sebaceous cysts in both breasts that explain the palpable masses.  These would require removal by surgery if they become bothersome or infected.  Next mammogram in 1 yr  Bacigalupo, Dionne Bucy, MD, MPH [facility] 10/16/2016 2:41 PM

## 2016-10-16 NOTE — Telephone Encounter (Signed)
Pt advised and verbalizes understanding

## 2016-11-06 ENCOUNTER — Other Ambulatory Visit: Payer: Self-pay | Admitting: Family Medicine

## 2016-11-06 NOTE — Telephone Encounter (Signed)
LOV with you 09/29/2016. HTN was well controlled at that time.

## 2016-12-15 ENCOUNTER — Encounter: Payer: Self-pay | Admitting: Family Medicine

## 2016-12-15 ENCOUNTER — Ambulatory Visit: Payer: Managed Care, Other (non HMO) | Admitting: Family Medicine

## 2016-12-15 VITALS — BP 142/80 | HR 80 | Temp 98.5°F | Resp 16 | Wt 172.4 lb

## 2016-12-15 DIAGNOSIS — R0981 Nasal congestion: Secondary | ICD-10-CM | POA: Diagnosis not present

## 2016-12-15 NOTE — Patient Instructions (Signed)

## 2016-12-15 NOTE — Progress Notes (Signed)
       Patient: Kim Ritter Female    DOB: 31-Mar-1955   61 y.o.   MRN: [id number] Visit Date: 12/15/2016  Today's Provider: Lavon Paganini, MD   Chief Complaint  Patient presents with  . Sinusitis   Subjective:    Sinusitis  This is a new problem. The current episode started yesterday. The problem has been gradually worsening since onset. There has been no fever (has felt feverish). The pain is moderate. Associated symptoms include congestion, coughing, headaches and sinus pressure. Past treatments include acetaminophen. The treatment provided mild relief.   Patient reports that she has also been taking Ibuprofen, which has helped a little.  Also tried benadryl and chloraseptic spray.  Non-productive cough caused by post-nasal drip.    No Known Allergies   Current Outpatient Medications:  .  amLODipine (NORVASC) 5 MG tablet, take 1 tablet by mouth daily, Disp: 30 tablet, Rfl: 11 .  atorvastatin (LIPITOR) 40 MG tablet, Take 1 tablet (40 mg total) by mouth daily., Disp: 90 tablet, Rfl: 3 .  Multiple Vitamin (MULTIVITAMIN WITH MINERALS) TABS tablet, Take 1 tablet by mouth daily., Disp: , Rfl:   Review of Systems  HENT: Positive for congestion and sinus pressure.   Respiratory: Positive for cough.   Neurological: Positive for headaches.    Social History   Tobacco Use  . Smoking status: Current Every Day Smoker    Packs/day: 1.00    Years: 25.00    Pack years: 25.00    Types: Cigarettes  . Smokeless tobacco: Never Used  Substance Use Topics  . Alcohol use: No   Objective:   BP (!) 142/80   Pulse 80   Temp 98.5 F (36.9 C)   Resp 16   Wt 172 lb 6.4 oz (78.2 kg)   BMI 31.53 kg/m  Vitals:   12/15/16 1335  BP: (!) 142/80  Pulse: 80  Resp: 16  Temp: 98.5 F (36.9 C)  Weight: 172 lb 6.4 oz (78.2 kg)     Physical Exam  Constitutional: She appears well-developed and well-nourished. No distress.  HENT:  Head: Normocephalic and atraumatic.  Right Ear:  External ear normal.  Left Ear: External ear normal.  Nose: Right sinus exhibits maxillary sinus tenderness. Right sinus exhibits no frontal sinus tenderness. Left sinus exhibits maxillary sinus tenderness. Left sinus exhibits no frontal sinus tenderness.  Mouth/Throat: Oropharynx is clear and moist. No oropharyngeal exudate.  Eyes: Conjunctivae are normal. Pupils are equal, round, and reactive to light. No scleral icterus.  Neck: Neck supple.  Cardiovascular: Normal rate, regular rhythm and normal heart sounds.  No murmur heard. Pulmonary/Chest: Effort normal and breath sounds normal. No respiratory distress. She has no wheezes. She has no rales.  Musculoskeletal: She exhibits no edema.  Lymphadenopathy:    She has no cervical adenopathy.  Vitals reviewed.       Assessment & Plan:     1. Sinus congestion - given short duration of symptoms, likely viral vs allergic - trial of flonase, decongestant (watch BP if taking decongestant) - return precautions discussed - would only consider abx after 7-10 days       The entirety of the information documented in the History of Present Illness, Review of Systems and Physical Exam were personally obtained by me. Portions of this information were initially documented by Wilburt Finlay, CMA and reviewed by me for thoroughness and accuracy.     Lavon Paganini, MD  [facility]

## 2017-04-02 ENCOUNTER — Ambulatory Visit: Payer: Managed Care, Other (non HMO) | Admitting: Family Medicine

## 2017-04-02 ENCOUNTER — Encounter: Payer: Self-pay | Admitting: Family Medicine

## 2017-04-02 VITALS — BP 150/85 | HR 99 | Temp 98.1°F | Resp 16 | Wt 175.0 lb

## 2017-04-02 DIAGNOSIS — Z720 Tobacco use: Secondary | ICD-10-CM | POA: Diagnosis not present

## 2017-04-02 DIAGNOSIS — Z6832 Body mass index (BMI) 32.0-32.9, adult: Secondary | ICD-10-CM

## 2017-04-02 DIAGNOSIS — E669 Obesity, unspecified: Secondary | ICD-10-CM | POA: Diagnosis not present

## 2017-04-02 DIAGNOSIS — I10 Essential (primary) hypertension: Secondary | ICD-10-CM

## 2017-04-02 DIAGNOSIS — E782 Mixed hyperlipidemia: Secondary | ICD-10-CM | POA: Diagnosis not present

## 2017-04-02 MED ORDER — AMLODIPINE BESYLATE 10 MG PO TABS: 10.0000 mg | ORAL_TABLET | Freq: Every day | ORAL | 5 refills | Status: DC

## 2017-04-02 NOTE — Progress Notes (Signed)
Patient: Kim Ritter Female    DOB: 1955/06/01   62 y.o.   MRN: [id number] Visit Date: 04/02/2017  Today's Provider: Lavon Paganini, MD   I, Kim Ritter, CMA, am acting as scribe for Lavon Paganini, MD.  Chief Complaint  Patient presents with  . Hypertension  . Hyperlipidemia   Subjective:    HPI      Hypertension, follow-up:  BP Readings from Last 3 Encounters:  04/02/17 (!) 150/85  12/15/16 (!) 142/80  09/29/16 128/80    She was last seen for hypertension 6 months ago.  BP at that visit was 128/80. Management since that visit includes continuing amlodipine 5 mg. She reports good compliance with treatment. She is not having side effects.  She is not exercising. She is adherent to low salt diet.   Outside blood pressures are 140/82 at the dentist last week. She is experiencing fatigue.  Patient denies chest pain, chest pressure/discomfort, claudication, dyspnea, exertional chest pressure/discomfort, irregular heart beat, lower extremity edema, near-syncope, orthopnea, palpitations and syncope.   Cardiovascular risk factors include dyslipidemia, hypertension and smoking/ tobacco exposure.  Use of agents associated with hypertension: none.     Weight trend: fluctuating a bit Wt Readings from Last 3 Encounters:  04/02/17 175 lb (79.4 kg)  12/15/16 172 lb 6.4 oz (78.2 kg)  09/29/16 169 lb (76.7 kg)    Current diet: improving per pt. Is cutting back on caffeinated drinks, but is eating more sweets.  ------------------------------------------------------------------------  Lipid/Cholesterol, Follow-up:   Last seen for this 6 months ago.  Management changes since that visit include continuing Lipitor that was started on 09/26/2016, as well as discussing diet and exercise changes. . Last Lipid Panel:    Component Value Date/Time   CHOL 252 (H) 09/25/2016 1223   TRIG 248 (H) 09/25/2016 1223   HDL 39 (L) 09/25/2016 1223   CHOLHDL 6.5 (H)  09/25/2016 1223   LDLCALC 163 (H) 09/25/2016 1223    Risk factors for vascular disease include hypercholesterolemia, hypertension and smoking  She reports excellent compliance with treatment. She is not having side effects.   -------------------------------------------------------------------  Follow up for Smoking Cessation  The patient was last seen for this 6 months ago. Changes made at last visit include encouraging pt to use patches and gum.  She states she is smoking about 3 cigarettes per day, which is improved from 1 PPD.  Does not have a quit date in mind. ------------------------------------------------------------------------------------   No Known Allergies   Current Outpatient Medications:  .  amLODipine (NORVASC) 10 MG tablet, Take 1 tablet (10 mg total) by mouth daily., Disp: 30 tablet, Rfl: 5 .  atorvastatin (LIPITOR) 40 MG tablet, Take 1 tablet (40 mg total) by mouth daily., Disp: 90 tablet, Rfl: 3 .  Multiple Vitamin (MULTIVITAMIN WITH MINERALS) TABS tablet, Take 1 tablet by mouth daily., Disp: , Rfl:   Review of Systems  Constitutional: Positive for fatigue. Negative for activity change, appetite change, chills, diaphoresis, fever and unexpected weight change.  Respiratory: Negative for shortness of breath.   Cardiovascular: Negative for chest pain, palpitations and leg swelling.  Musculoskeletal: Positive for back pain.    Social History   Tobacco Use  . Smoking status: Current Every Day Smoker    Packs/day: 0.25    Years: 25.00    Pack years: 6.25    Types: Cigarettes  . Smokeless tobacco: Never Used  . Tobacco comment: is smoking 3 cigarettes per day, which is down from  1 PPD  Substance Use Topics  . Alcohol use: No   Objective:   BP (!) 150/85   Pulse 99   Temp 98.1 F (36.7 C) (Oral)   Resp 16   Wt 175 lb (79.4 kg)   SpO2 96%   BMI 32.01 kg/m  Vitals:   04/02/17 1520 04/02/17 1552  BP: (!) 160/82 (!) 150/85  Pulse: 99   Resp: 16     Temp: 98.1 F (36.7 C)   TempSrc: Oral   SpO2: 96%   Weight: 175 lb (79.4 kg)      Physical Exam  Constitutional: She is oriented to person, place, and time. She appears well-developed and well-nourished. No distress.  HENT:  Head: Normocephalic and atraumatic.  Eyes: Conjunctivae are normal. No scleral icterus.  Cardiovascular: Normal rate, regular rhythm, normal heart sounds and intact distal pulses.  No murmur heard. Pulmonary/Chest: Effort normal and breath sounds normal. No respiratory distress. She has no wheezes. She has no rales.  Musculoskeletal: She exhibits no edema or deformity.  Neurological: She is alert and oriented to person, place, and time.  Skin: Skin is warm and dry. No rash noted.  Psychiatric: She has a normal mood and affect. Her behavior is normal.  Vitals reviewed.       Assessment & Plan:     Problem List Items Addressed This Visit      Cardiovascular and Mediastinum   HTN (hypertension) - Primary    Uncontrolled, even on recheck at end of visit Increase amlodipine to 10mg  daily F/u in 6 months      Relevant Medications   amLODipine (NORVASC) 10 MG tablet     Other   Obesity    Discussed diet and exercise      HLD (hyperlipidemia)    Doing well on lipitor without side effects Will continue Recheck lipid panel, CMP      Relevant Medications   amLODipine (NORVASC) 10 MG tablet   Other Relevant Orders   Lipid panel   Comprehensive metabolic panel   Tobacco abuse    Encouraged patient to continue working toward cessation Congratulated her on cutting back from 1 PPD to 3 cig/day F/u in 6 months          Return in about 6 months (around 09/30/2017) for physical.   The entirety of the information documented in the History of Present Illness, Review of Systems and Physical Exam were personally obtained by me. Portions of this information were initially documented by Raquel Sarna Ratchford, CMA and reviewed by me for thoroughness and  accuracy.    Virginia Crews, MD, MPH [facility] 04/02/2017 3:56 PM

## 2017-04-02 NOTE — Assessment & Plan Note (Signed)
Encouraged patient to continue working toward cessation Congratulated her on cutting back from 1 PPD to 3 cig/day F/u in 6 months

## 2017-04-02 NOTE — Patient Instructions (Signed)
Steps to Quit Smoking Smoking tobacco can be harmful to your health and can affect almost every organ in your body. Smoking puts you, and those around you, at risk for developing many serious chronic diseases. Quitting smoking is difficult, but it is one of the best things that you can do for your health. It is never too late to quit. What are the benefits of quitting smoking? When you quit smoking, you lower your risk of developing serious diseases and conditions, such as:  Lung cancer or lung disease, such as COPD.  Heart disease.  Stroke.  Heart attack.  Infertility.  Osteoporosis and bone fractures.  Additionally, symptoms such as coughing, wheezing, and shortness of breath may get better when you quit. You may also find that you get sick less often because your body is stronger at fighting off colds and infections. If you are pregnant, quitting smoking can help to reduce your chances of having a baby of low birth weight. How do I get ready to quit? When you decide to quit smoking, create a plan to make sure that you are successful. Before you quit:  Pick a date to quit. Set a date within the next two weeks to give you time to prepare.  Write down the reasons why you are quitting. Keep this list in places where you will see it often, such as on your bathroom mirror or in your car or wallet.  Identify the people, places, things, and activities that make you want to smoke (triggers) and avoid them. Make sure to take these actions: ? Throw away all cigarettes at home, at work, and in your car. ? Throw away smoking accessories, such as ashtrays and lighters. ? Clean your car and make sure to empty the ashtray. ? Clean your home, including curtains and carpets.  Tell your family, friends, and coworkers that you are quitting. Support from your loved ones can make quitting easier.  Talk with your health care provider about your options for quitting smoking.  Find out what treatment  options are covered by your health insurance.  What strategies can I use to quit smoking? Talk with your healthcare provider about different strategies to quit smoking. Some strategies include:  Quitting smoking altogether instead of gradually lessening how much you smoke over a period of time. Research shows that quitting "cold turkey" is more successful than gradually quitting.  Attending in-person counseling to help you build problem-solving skills. You are more likely to have success in quitting if you attend several counseling sessions. Even short sessions of 10 minutes can be effective.  Finding resources and support systems that can help you to quit smoking and remain smoke-free after you quit. These resources are most helpful when you use them often. They can include: ? Online chats with a counselor. ? Telephone quitlines. ? Printed self-help materials. ? Support groups or group counseling. ? Text messaging programs. ? Mobile phone applications.  Taking medicines to help you quit smoking. (If you are pregnant or breastfeeding, talk with your health care provider first.) Some medicines contain nicotine and some do not. Both types of medicines help with cravings, but the medicines that include nicotine help to relieve withdrawal symptoms. Your health care provider may recommend: ? Nicotine patches, gum, or lozenges. ? Nicotine inhalers or sprays. ? Non-nicotine medicine that is taken by mouth.  Talk with your health care provider about combining strategies, such as taking medicines while you are also receiving in-person counseling. Using these two strategies together   makes you more likely to succeed in quitting than if you used either strategy on its own. If you are pregnant or breastfeeding, talk with your health care provider about finding counseling or other support strategies to quit smoking. Do not take medicine to help you quit smoking unless told to do so by your health care  provider. What things can I do to make it easier to quit? Quitting smoking might feel overwhelming at first, but there is a lot that you can do to make it easier. Take these important actions:  Reach out to your family and friends and ask that they support and encourage you during this time. Call telephone quitlines, reach out to support groups, or work with a counselor for support.  Ask people who smoke to avoid smoking around you.  Avoid places that trigger you to smoke, such as bars, parties, or smoke-break areas at work.  Spend time around people who do not smoke.  Lessen stress in your life, because stress can be a smoking trigger for some people. To lessen stress, try: ? Exercising regularly. ? Deep-breathing exercises. ? Yoga. ? Meditating. ? Performing a body scan. This involves closing your eyes, scanning your body from head to toe, and noticing which parts of your body are particularly tense. Purposefully relax the muscles in those areas.  Download or purchase mobile phone or tablet apps (applications) that can help you stick to your quit plan by providing reminders, tips, and encouragement. There are many free apps, such as QuitGuide from the CDC (Centers for Disease Control and Prevention). You can find other support for quitting smoking (smoking cessation) through smokefree.gov and other websites.  How will I feel when I quit smoking? Within the first 24 hours of quitting smoking, you may start to feel some withdrawal symptoms. These symptoms are usually most noticeable 2-3 days after quitting, but they usually do not last beyond 2-3 weeks. Changes or symptoms that you might experience include:  Mood swings.  Restlessness, anxiety, or irritation.  Difficulty concentrating.  Dizziness.  Strong cravings for sugary foods in addition to nicotine.  Mild weight gain.  Constipation.  Nausea.  Coughing or a sore throat.  Changes in how your medicines work in your  body.  A depressed mood.  Difficulty sleeping (insomnia).  After the first 2-3 weeks of quitting, you may start to notice more positive results, such as:  Improved sense of smell and taste.  Decreased coughing and sore throat.  Slower heart rate.  Lower blood pressure.  Clearer skin.  The ability to breathe more easily.  Fewer sick days.  Quitting smoking is very challenging for most people. Do not get discouraged if you are not successful the first time. Some people need to make many attempts to quit before they achieve long-term success. Do your best to stick to your quit plan, and talk with your health care provider if you have any questions or concerns. This information is not intended to replace advice given to you by your health care provider. Make sure you discuss any questions you have with your health care provider. Document Released: 01/17/2001 Document Revised: 09/21/2015 Document Reviewed: 06/09/2014 Elsevier Interactive Patient Education  2018 Elsevier Inc.  

## 2017-04-02 NOTE — Assessment & Plan Note (Signed)
Discussed diet and exercise 

## 2017-04-02 NOTE — Assessment & Plan Note (Signed)
Doing well on lipitor without side effects Will continue Recheck lipid panel, CMP

## 2017-04-02 NOTE — Assessment & Plan Note (Addendum)
Uncontrolled, even on recheck at end of visit Increase amlodipine to 10mg  daily F/u in 6 months

## 2017-10-01 ENCOUNTER — Ambulatory Visit: Payer: Managed Care, Other (non HMO) | Admitting: Family Medicine

## 2017-10-01 ENCOUNTER — Encounter: Payer: Self-pay | Admitting: Family Medicine

## 2017-10-01 VITALS — BP 128/82 | HR 93 | Temp 98.9°F | Wt 169.0 lb

## 2017-10-01 DIAGNOSIS — E669 Obesity, unspecified: Secondary | ICD-10-CM | POA: Diagnosis not present

## 2017-10-01 DIAGNOSIS — Z720 Tobacco use: Secondary | ICD-10-CM

## 2017-10-01 DIAGNOSIS — E782 Mixed hyperlipidemia: Secondary | ICD-10-CM | POA: Diagnosis not present

## 2017-10-01 DIAGNOSIS — Z23 Encounter for immunization: Secondary | ICD-10-CM

## 2017-10-01 DIAGNOSIS — R7303 Prediabetes: Secondary | ICD-10-CM

## 2017-10-01 DIAGNOSIS — Z6830 Body mass index (BMI) 30.0-30.9, adult: Secondary | ICD-10-CM

## 2017-10-01 DIAGNOSIS — I10 Essential (primary) hypertension: Secondary | ICD-10-CM

## 2017-10-01 MED ORDER — ATORVASTATIN CALCIUM 40 MG PO TABS: 40.0000 mg | ORAL_TABLET | Freq: Every day | ORAL | 3 refills | Status: DC

## 2017-10-01 MED ORDER — AMLODIPINE BESYLATE 10 MG PO TABS: 10.0000 mg | ORAL_TABLET | Freq: Every day | ORAL | 3 refills | Status: DC

## 2017-10-01 MED ORDER — BUPROPION HCL ER (SR) 150 MG PO TB12: 150.0000 mg | ORAL_TABLET | Freq: Two times a day (BID) | ORAL | 2 refills | Status: DC

## 2017-10-01 NOTE — Progress Notes (Signed)
Patient: Kim Ritter Female    DOB: 1956/02/05   62 y.o.   MRN: [id number] Visit Date: 10/01/2017  Today's Provider: Lavon Paganini, MD   Chief Complaint  Patient presents with  . Hypertension  . Hyperlipidemia  . Smoking Cessation   Subjective:    HPI I, Tiburcio Pea, CMA, am acting as a Education administrator for Lavon Paganini, MD.    Hypertension, follow-up:  BP Readings from Last 3 Encounters:  10/01/17 128/82  04/02/17 (!) 150/85  12/15/16 (!) 142/80   She was last seen for hypertension 6 months ago.  BP at that visit was 150/85. Management since that visit includes increase Amlodipine 10 mg. She reports good compliance with treatment. She is not having side effects.  She is not exercising. She is adherent to low salt diet.   Outside blood pressures are being checked periodically. She is experiencing none .  Patient denies chest pain, chest pressure/discomfort, claudication, dyspnea, exertional chest pressure/discomfort, irregular heart beat, lower extremity edema, near-syncope, orthopnea, palpitations and syncope.   Cardiovascular risk factors include dyslipidemia, hypertension and smoking/ tobacco exposure.  Use of agents associated with hypertension: none.                Weight trend: fluctuating a bit    Wt Readings from Last 3 Encounters:  04/02/17 175 lb (79.4 kg)  12/15/16 172 lb 6.4 oz (78.2 kg)  09/29/16 169 lb (76.7 kg)    ------------------------------------------------------------------------  Lipid/Cholesterol, Follow-up:   Last seen for this 6 months ago.  Management changes since that visit include continuing Lipitor, diet, and exercise. . Last Lipid Panel: Lab Results  Component Value Date   CHOL 252 (H) 09/25/2016   HDL 39 (L) 09/25/2016   LDLCALC 163 (H) 09/25/2016   TRIG 248 (H) 09/25/2016   CHOLHDL 6.5 (H) 09/25/2016    Risk factors for vascular disease include hypercholesterolemia, hypertension and smoking  She  reports fair compliance with treatment as it was difficult to remember (misses it a few times a week). Patient states she is not exercising.  She is not having side effects.    Pre-diabetes - Checking BG at home: no - Medications: none - Compliance: n/a - Diet: low carb - was previously eating a candy bar per day - Exercise: none - denies symptoms of hypoglycemia, polyuria, polydipsia, numbness of extremities, foot ulcers/trauma   -------------------------------------------------------------------  Follow up for Smoking Cessation  The patient was last seen for this 6 months ago. Changes made at last visit include encouraging pt to use patches and gum. She declined at last OV and wanted to attempt stopping alone.    She states she is smoking about 4 cigarettes per day.   No Known Allergies   Current Outpatient Medications:  .  amLODipine (NORVASC) 10 MG tablet, Take 1 tablet (10 mg total) by mouth daily., Disp: 30 tablet, Rfl: 5 .  atorvastatin (LIPITOR) 40 MG tablet, Take 1 tablet (40 mg total) by mouth daily., Disp: 90 tablet, Rfl: 3 .  Multiple Vitamin (MULTIVITAMIN WITH MINERALS) TABS tablet, Take 1 tablet by mouth daily., Disp: , Rfl:   Review of Systems  Constitutional: Negative.   HENT: Negative.   Respiratory: Negative.   Cardiovascular: Negative.   Gastrointestinal: Negative.   Endocrine: Negative.   Genitourinary: Negative.   Neurological: Negative.   Hematological: Negative for adenopathy. Bruises/bleeds easily.  Psychiatric/Behavioral: Negative.     Social History   Tobacco Use  . Smoking status: Current Every  Day Smoker    Packs/day: 0.25    Years: 25.00    Pack years: 6.25    Types: Cigarettes  . Smokeless tobacco: Never Used  . Tobacco comment: is smoking 3 cigarettes per day, which is down from 1 PPD  Substance Use Topics  . Alcohol use: No   Objective:   BP 128/82 (BP Location: Right Arm, Patient Position: Sitting, Cuff Size: Normal)    Pulse 93   Temp 98.9 F (37.2 C) (Oral)   Wt 169 lb (76.7 kg)   SpO2 97%   BMI 30.91 kg/m  Vitals:   10/01/17 1539  BP: 128/82  Pulse: 93  Temp: 98.9 F (37.2 C)  TempSrc: Oral  SpO2: 97%  Weight: 169 lb (76.7 kg)     Physical Exam  Constitutional: She is oriented to person, place, and time. She appears well-developed and well-nourished. No distress.  HENT:  Head: Normocephalic and atraumatic.  Eyes: Conjunctivae are normal. No scleral icterus.  Neck: Neck supple. No thyromegaly present.  Cardiovascular: Normal rate, regular rhythm, normal heart sounds and intact distal pulses.  No murmur heard. Pulmonary/Chest: Effort normal and breath sounds normal. No respiratory distress. She has no wheezes. She has no rales.  Musculoskeletal: She exhibits no edema or deformity.  Lymphadenopathy:    She has no cervical adenopathy.  Neurological: She is alert and oriented to person, place, and time.  Skin: Skin is warm and dry. Capillary refill takes less than 2 seconds. No rash noted.  Psychiatric: She has a normal mood and affect. Her behavior is normal.  Vitals reviewed.      Assessment & Plan:   Problem List Items Addressed This Visit      Cardiovascular and Mediastinum   HTN (hypertension) - Primary    Well-controlled today Continue amlodipine 10 mg daily Recheck CMP Follow-up in 6 months      Relevant Medications   atorvastatin (LIPITOR) 40 MG tablet   amLODipine (NORVASC) 10 MG tablet   Other Relevant Orders   Comprehensive metabolic panel     Other   Obesity    Discussed importance of healthy weight management Discussed diet and exercise recommendations      Relevant Orders   Comprehensive metabolic panel   Tobacco abuse    Approximately 5-minute discussion with the patient regarding importance of tobacco cessation, health risks of continued tobacco use, and methods for cessation Patient has done well cutting back on her own from 1 pack/day, but she is  stalled around 4 cigarettes/day She has tried Chantix without success in the past She has never tried bupropion, and agrees to try this for smoking cessation Start bupropion SR 150 mg twice daily Discussed potential side effects Follow-up in 2 months Discussed with patient that she will continue bupropion for 7 to 12 weeks after cessation of smoking      Hyperlipidemia    Previously quite elevated with ASCVD 10-year risk of 15.7%  discussed that quitting smoking will help decrease this risk Discussed importance of taking her Lipitor daily with good compliance She will move her amlodipine dose to the evenings to take with her Lipitor in hopes that this will help compliance      Relevant Medications   atorvastatin (LIPITOR) 40 MG tablet   amLODipine (NORVASC) 10 MG tablet   Other Relevant Orders   Lipid panel   Comprehensive metabolic panel   Prediabetes    Last A1c 5.7 Patient working on low-carb diet Recheck A1c Discussed diet and exercise  Relevant Orders   Hemoglobin A1c    Other Visit Diagnoses    Need for influenza vaccination       Relevant Orders   Flu Vaccine QUAD 6+ mos PF IM (Fluarix Quad PF)       Return in about 2 months (around 12/01/2017) for smoking f/u.   The entirety of the information documented in the History of Present Illness, Review of Systems and Physical Exam were personally obtained by me. Portions of this information were initially documented by Tiburcio Pea, CMA and reviewed by me for thoroughness and accuracy.    Virginia Crews, MD, MPH [facility] 10/01/2017 4:15 PM

## 2017-10-01 NOTE — Assessment & Plan Note (Signed)
Discussed importance of healthy weight management Discussed diet and exercise recommendations

## 2017-10-01 NOTE — Assessment & Plan Note (Signed)
Well-controlled today Continue amlodipine 10 mg daily Recheck CMP Follow-up in 6 months

## 2017-10-01 NOTE — Assessment & Plan Note (Signed)
Last A1c 5.7 Patient working on [employer] Recheck A1c Discussed diet and exercise

## 2017-10-01 NOTE — Assessment & Plan Note (Signed)
Approximately 5-minute discussion with the patient regarding importance of tobacco cessation, health risks of continued tobacco use, and methods for cessation Patient has done well cutting back on her own from 1 pack/day, but she is stalled around 4 cigarettes/day She has tried Chantix without success in the past She has never tried bupropion, and agrees to try this for smoking cessation Start bupropion SR 150 mg twice daily Discussed potential side effects Follow-up in 2 months Discussed with patient that she will continue bupropion for 7 to 12 weeks after cessation of smoking

## 2017-10-01 NOTE — Patient Instructions (Signed)
Bupropion sustained-release tablets (smoking cessation)  What is this medicine?  BUPROPION (byoo PROE pee on) is used to help people quit smoking.  This medicine may be used for other purposes; ask your health care provider or pharmacist if you have questions.  COMMON BRAND NAME(S): Buproban, Zyban  What should I tell my health care provider before I take this medicine?  They need to know if you have any of these conditions:  -an eating disorder, such as anorexia or bulimia  -bipolar disorder or psychosis  -diabetes or high blood sugar, treated with medication  -glaucoma  -head injury or brain tumor  -heart disease, previous heart attack, or irregular heart beat  -high blood pressure  -kidney or liver disease  -seizures  -suicidal thoughts or a previous suicide attempt  -Tourette's syndrome  -weight loss  -an unusual or allergic reaction to bupropion, other medicines, foods, dyes, or preservatives  -breast-feeding  -pregnant or trying to become pregnant  How should I use this medicine?  Take this medicine by mouth with a glass of water. Follow the directions on the prescription label. You can take it with or without food. If it upsets your stomach, take it with food. Do not cut, crush or chew this medicine. Take your medicine at regular intervals. If you take this medicine more than once a day, take your second dose at least 8 hours after you take your first dose. To limit difficulty in sleeping, avoid taking this medicine at bedtime. Do not take your medicine more often than directed. Do not stop taking this medicine suddenly except upon the advice of your doctor. Stopping this medicine too quickly may cause serious side effects.  A special MedGuide will be given to you by the pharmacist with each prescription and refill. Be sure to read this information carefully each time.  Talk to your pediatrician regarding the use of this medicine in children. Special care may be needed.  Overdosage: If you think you have  taken too much of this medicine contact a poison control center or emergency room at once.  NOTE: This medicine is only for you. Do not share this medicine with others.  What if I miss a dose?  If you miss a dose, skip the missed dose and take your next tablet at the regular time. There should be at least 8 hours between doses. Do not take double or extra doses.  What may interact with this medicine?  Do not take this medicine with any of the following medications:  -linezolid  -MAOIs like Azilect, Carbex, Eldepryl, Marplan, Nardil, and Parnate  -methylene blue (injected into a vein)  -other medicines that contain bupropion like Wellbutrin  This medicine may also interact with the following medications:  -alcohol  -certain medicines for anxiety or sleep  -certain medicines for blood pressure like metoprolol, propranolol  -certain medicines for depression or psychotic disturbances  -certain medicines for HIV or AIDS like efavirenz, lopinavir, nelfinavir, ritonavir  -certain medicines for irregular heart beat like propafenone, flecainide  -certain medicines for Parkinson's disease like amantadine, levodopa  -certain medicines for seizures like carbamazepine, phenytoin, phenobarbital  -cimetidine  -clopidogrel  -cyclophosphamide  -digoxin  -furazolidone  -isoniazid  -nicotine  -orphenadrine  -procarbazine  -steroid medicines like prednisone or cortisone  -stimulant medicines for attention disorders, weight loss, or to stay awake  -tamoxifen  -theophylline  -thiotepa  -ticlopidine  -tramadol  -warfarin  This list may not describe all possible interactions. Give your health care provider a list   patient support program. It is important to participate in a behavioral program, counseling, or other support program that is recommended by your health care professional. Patients and their families should watch out for new or worsening thoughts of suicide or depression. Also watch out for sudden changes in feelings such as feeling anxious, agitated, panicky, irritable, hostile, aggressive, impulsive, severely restless, overly excited and hyperactive, or not being able to sleep. If this happens, especially at the beginning of treatment or after a change in dose, call your health care professional. Avoid alcoholic drinks while taking this medicine. Drinking excessive alcoholic beverages, using sleeping or anxiety medicines, or quickly stopping the use of these agents while taking this medicine may increase your risk for a seizure. Do not drive or use heavy machinery until you know how this medicine affects you. This medicine can impair your ability to perform these tasks. Do not take this medicine close to bedtime. It may prevent you from sleeping. Your mouth may get dry. Chewing sugarless gum or sucking hard candy, and drinking plenty of water may help. Contact your doctor if the problem does not go away or is severe. Do not use nicotine patches or chewing gum without the advice of your doctor or health care professional while taking this medicine. You may need to have your blood pressure taken regularly if your doctor recommends that you use both nicotine and this medicine together. What side effects may I notice from receiving this medicine? Side effects that you should report to your doctor or health care professional as soon as possible: -allergic reactions like skin rash, itching or hives, swelling of the face, lips, or tongue -breathing problems -changes in vision -confusion -elevated mood, decreased need for sleep, racing thoughts, impulsive  behavior -fast or irregular heartbeat -hallucinations, loss of contact with reality -increased blood pressure -redness, blistering, peeling or loosening of the skin, including inside the mouth -seizures -suicidal thoughts or other mood changes -unusually weak or tired -vomiting Side effects that usually do not require medical attention (report to your doctor or health care professional if they continue or are bothersome): -constipation -headache -loss of appetite -nausea -tremors -weight loss This list may not describe all possible side effects. Call your doctor for medical advice about side effects. You may report side effects to FDA at 1-800-FDA-1088. Where should I keep my medicine? Keep out of the reach of children. Store at room temperature between 20 and 25 degrees C (68 and 77 degrees F). Protect from light. Keep container tightly closed. Throw away any unused medicine after the expiration date. NOTE: This sheet is a summary. It may not cover all possible information. If you have questions about this medicine, talk to your doctor, pharmacist, or health care provider.  2018 Elsevier/Gold Standard (2015-07-16 13:49:28)  

## 2017-10-01 NOTE — Assessment & Plan Note (Signed)
Previously quite elevated with ASCVD 10-year risk of 15.7%  discussed that quitting smoking will help decrease this risk Discussed importance of taking her Lipitor daily with good compliance She will move her amlodipine dose to the evenings to take with her Lipitor in hopes that this will help compliance

## 2017-10-03 LAB — COMPREHENSIVE METABOLIC PANEL
A/G RATIO: 1.4 (ref 1.2–2.2)
ALT: 27 IU/L (ref 0–32)
AST: 29 IU/L (ref 0–40)
Albumin: 3.9 g/dL (ref 3.6–4.8)
Alkaline Phosphatase: 113 IU/L (ref 39–117)
BUN/Creatinine Ratio: 11 — ABNORMAL LOW (ref 12–28)
BUN: 8 mg/dL (ref 8–27)
Bilirubin Total: 0.3 mg/dL (ref 0.0–1.2)
CALCIUM: 9.1 mg/dL (ref 8.7–10.3)
CO2: 21 mmol/L (ref 20–29)
Chloride: 102 mmol/L (ref 96–106)
Creatinine, Ser: 0.74 mg/dL (ref 0.57–1.00)
GFR, EST AFRICAN AMERICAN: 100 mL/min/{1.73_m2} (ref >59)
GFR, EST NON AFRICAN AMERICAN: 87 mL/min/{1.73_m2} (ref >59)
Globulin, Total: 2.7 g/dL (ref 1.5–4.5)
Glucose: 105 mg/dL — ABNORMAL HIGH (ref 65–99)
Potassium: 3.9 mmol/L (ref 3.5–5.2)
Sodium: 140 mmol/L (ref 134–144)
TOTAL PROTEIN: 6.6 g/dL (ref 6.0–8.5)

## 2017-10-03 LAB — LIPID PANEL
CHOLESTEROL TOTAL: 216 mg/dL — AB (ref 100–199)
Chol/HDL Ratio: 6 ratio — ABNORMAL HIGH (ref 0.0–4.4)
HDL: 36 mg/dL — ABNORMAL LOW (ref >39)
LDL Calculated: 142 mg/dL — ABNORMAL HIGH (ref 0–99)
Triglycerides: 188 mg/dL — ABNORMAL HIGH (ref 0–149)
VLDL CHOLESTEROL CAL: 38 mg/dL (ref 5–40)

## 2017-10-03 LAB — HEMOGLOBIN A1C
ESTIMATED AVERAGE GLUCOSE: 117 mg/dL
HEMOGLOBIN A1C: 5.7 % — AB (ref 4.8–5.6)

## 2017-12-03 ENCOUNTER — Encounter: Payer: Self-pay | Admitting: Family Medicine

## 2017-12-03 ENCOUNTER — Ambulatory Visit: Payer: Managed Care, Other (non HMO) | Admitting: Family Medicine

## 2017-12-03 VITALS — BP 152/84 | HR 91 | Temp 98.3°F | Wt 174.8 lb

## 2017-12-03 DIAGNOSIS — Z6831 Body mass index (BMI) 31.0-31.9, adult: Secondary | ICD-10-CM | POA: Diagnosis not present

## 2017-12-03 DIAGNOSIS — E669 Obesity, unspecified: Secondary | ICD-10-CM | POA: Diagnosis not present

## 2017-12-03 DIAGNOSIS — E66811 Obesity, class 1: Secondary | ICD-10-CM

## 2017-12-03 DIAGNOSIS — Z720 Tobacco use: Secondary | ICD-10-CM

## 2017-12-03 DIAGNOSIS — I10 Essential (primary) hypertension: Secondary | ICD-10-CM

## 2017-12-03 NOTE — Progress Notes (Signed)
Patient: Kim Ritter Female    DOB: 1955/03/29   62 y.o.   MRN: [id number] Visit Date: 12/03/2017  Today's Provider: Lavon Paganini, MD   Chief Complaint  Patient presents with  . Smoking Cessation   Subjective:    HPI Smoking Cessation:  Patient presents for a 2 month follow up. Last OV was on 10/01/2017. Patient started Bupropion SR 150 mg BID. She reports good compliance with treatment plan. She states she is currently smoking 10 cigarettes per day. She states she still has a craving with the medication.     No Known Allergies   Current Outpatient Medications:  .  amLODipine (NORVASC) 10 MG tablet, Take 1 tablet (10 mg total) by mouth daily., Disp: 90 tablet, Rfl: 3 .  atorvastatin (LIPITOR) 40 MG tablet, Take 1 tablet (40 mg total) by mouth daily., Disp: 90 tablet, Rfl: 3 .  buPROPion (WELLBUTRIN SR) 150 MG 12 hr tablet, Take 1 tablet (150 mg total) by mouth 2 (two) times daily., Disp: 60 tablet, Rfl: 2 .  Multiple Vitamin (MULTIVITAMIN WITH MINERALS) TABS tablet, Take 1 tablet by mouth daily., Disp: , Rfl:   Review of Systems  Constitutional: Negative.   HENT: Negative.   Respiratory: Negative.   Cardiovascular: Negative.   Musculoskeletal: Negative.   Neurological: Negative.     Social History   Tobacco Use  . Smoking status: Current Every Day Smoker    Packs/day: 0.50    Years: 25.00    Pack years: 12.50    Types: Cigarettes  . Smokeless tobacco: Never Used  Substance Use Topics  . Alcohol use: No   Objective:   BP (!) 152/84 (BP Location: Right Arm, Patient Position: Sitting, Cuff Size: Large)   Pulse 91   Temp 98.3 F (36.8 C) (Oral)   Wt 174 lb 12.8 oz (79.3 kg)   SpO2 97%   BMI 31.97 kg/m  Vitals:   12/03/17 1534  BP: (!) 152/84  Pulse: 91  Temp: 98.3 F (36.8 C)  TempSrc: Oral  SpO2: 97%  Weight: 174 lb 12.8 oz (79.3 kg)     Physical Exam  Constitutional: She is oriented to person, place, and time. She appears  well-developed and well-nourished. No distress.  HENT:  Head: Normocephalic and atraumatic.  Mouth/Throat: Oropharynx is clear and moist.  Eyes: Conjunctivae are normal. No scleral icterus.  Neck: Neck supple. No thyromegaly present.  Cardiovascular: Normal rate, regular rhythm, normal heart sounds and intact distal pulses.  No murmur heard. Pulmonary/Chest: Effort normal and breath sounds normal. No respiratory distress. She has no wheezes. She has no rales.  Musculoskeletal: She exhibits no edema.  Lymphadenopathy:    She has no cervical adenopathy.  Neurological: She is alert and oriented to person, place, and time.  Skin: Skin is warm and dry. Capillary refill takes less than 2 seconds. No rash noted.  Psychiatric: She has a normal mood and affect. Her behavior is normal.  Vitals reviewed.       Assessment & Plan:   Problem List Items Addressed This Visit      Cardiovascular and Mediastinum   HTN (hypertension)    Elevated today, but patient states is been well controlled at home Previously well controlled here as well We will continue amlodipine 10 mg daily We will follow-up at next visit Has recent labs, so we will wait until next visit to recheck these        Other   Obesity  Discussed importance of healthy weight management, diet, and exercise Patient seems to yo-yo between 175 and 169      Tobacco abuse - Primary    Long discussion with the patient regarding importance of tobacco cessation, health risks of continued tach tobacco use, and mindset for quitting She is tried Chantix in the past without success She is doing well on bupropion and she is on adequate dose which we will continue Believe that she needs to get her mindset right and pick a firm quit date Will f/u in 3 months          Return in about 3 months (around 03/05/2018) for hypertension and tobacco f/u.   The entirety of the information documented in the History of Present Illness, Review  of Systems and Physical Exam were personally obtained by me. Portions of this information were initially documented by Tiburcio Pea, CMA and reviewed by me for thoroughness and accuracy.    Virginia Crews, MD, MPH [facility] 12/03/2017 4:59 PM

## 2017-12-03 NOTE — Assessment & Plan Note (Signed)
Elevated today, but patient states is been well controlled at home Previously well controlled here as well We will continue amlodipine 10 mg daily We will follow-up at next visit Has recent labs, so we will wait until next visit to recheck these

## 2017-12-03 NOTE — Assessment & Plan Note (Signed)
Long discussion with the patient regarding importance of tobacco cessation, health risks of continued tach tobacco use, and mindset for quitting She is tried Chantix in the past without success She is doing well on bupropion and she is on adequate dose which we will continue Believe that she needs to get her mindset right and pick a firm quit date Will f/u in 3 months

## 2017-12-03 NOTE — Assessment & Plan Note (Signed)
Discussed importance of healthy weight management, diet, and exercise Patient seems to yo-yo between 175 and 169

## 2017-12-03 NOTE — Patient Instructions (Signed)
Health Risks of Smoking  Smoking cigarettes is very bad for your health. Tobacco smoke has over 200 known poisons in it. It contains the poisonous gases nitrogen oxide and carbon monoxide. There are over 60 chemicals in tobacco smoke that cause cancer.  Smoking is difficult to quit because a chemical in tobacco, called nicotine, causes addiction or dependence. When you smoke and inhale, nicotine is absorbed rapidly into the bloodstream through your lungs. Both inhaled and non-inhaled nicotine may be addictive.  What are the risks of cigarette smoke?  Cigarette smokers have an increased risk of many serious medical problems, including:  · Lung cancer.  · Lung disease, such as pneumonia, bronchitis, and emphysema.  · Chest pain (angina) and heart attack because the heart is not getting enough oxygen.  · Heart disease and peripheral blood vessel disease.  · High blood pressure (hypertension).  · Stroke.  · Oral cancer, including cancer of the lip, mouth, or voice box.  · Bladder cancer.  · Pancreatic cancer.  · Cervical cancer.  · Pregnancy complications, including premature birth.  · Stillbirths and smaller newborn babies, birth defects, and genetic damage to sperm.  · Early menopause.  · Lower estrogen level for women.  · Infertility.  · Facial wrinkles.  · Blindness.  · Increased risk of broken bones (fractures).  · Senile dementia.  · Stomach ulcers and internal bleeding.  · Delayed wound healing and increased risk of complications during surgery.  · Even smoking lightly shortens your life expectancy by several years.    Because of secondhand smoke exposure, children of smokers have an increased risk of the following:  · Sudden infant death syndrome (SIDS).  · Respiratory infections.  · Lung cancer.  · Heart disease.  · Ear infections.    What are the benefits of quitting?  There are many health benefits of quitting smoking. Here are some of them:   · Within days of quitting smoking, your risk of having a heart attack decreases, your blood flow improves, and your lung capacity improves. Blood pressure, pulse rate, and breathing patterns start returning to normal soon after quitting.  · Within months, your lungs may clear up completely.  · Quitting for 10 years reduces your risk of developing lung cancer and heart disease to almost that of a nonsmoker.  · People who quit may see an improvement in their overall quality of life.    How do I quit smoking?  Smoking is an addiction with both physical and psychological effects, and longtime habits can be hard to change. Your health care provider can recommend:  · Programs and community resources, which may include group support, education, or talk therapy.  · Prescription medicines to help reduce cravings.  · Nicotine replacement products, such as patches, gum, and nasal sprays. Use these products only as directed. Do not replace cigarette smoking with electronic cigarettes, which are commonly called e-cigarettes. The safety of e-cigarettes is not known, and some may contain harmful chemicals.  · A combination of two or more of these methods.    Where to find more information:  · American Lung Association: www.lung.org  · American Cancer Society: www.cancer.org  Summary  · Smoking cigarettes is very bad for your health. Cigarette smokers have an increased risk of many serious medical problems, including several cancers, heart disease, and stroke.  · Smoking is an addiction with both physical and psychological effects, and longtime habits can be hard to change.  · By stopping right away, you   can greatly reduce the risk of medical problems for you and your family.  · To help you quit smoking, your health care provider can recommend programs, community resources, prescription medicines, and nicotine replacement products such as patches, gum, and nasal sprays.   This information is not intended to replace advice given to you by your health care provider. Make sure you discuss any questions you have with your health care provider.  Document Released: 03/02/2004 Document Revised: 01/28/2016 Document Reviewed: 01/28/2016  Elsevier Interactive Patient Education © 2017 [employer]vier [employer].

## 2017-12-24 ENCOUNTER — Other Ambulatory Visit: Payer: Self-pay | Admitting: Family Medicine

## 2018-03-04 ENCOUNTER — Ambulatory Visit: Payer: Managed Care, Other (non HMO) | Admitting: Family Medicine

## 2018-03-04 ENCOUNTER — Encounter: Payer: Self-pay | Admitting: Family Medicine

## 2018-03-04 VITALS — BP 146/82 | HR 96 | Temp 99.3°F | Ht 62.0 in | Wt 178.4 lb

## 2018-03-04 DIAGNOSIS — Z6832 Body mass index (BMI) 32.0-32.9, adult: Secondary | ICD-10-CM

## 2018-03-04 DIAGNOSIS — Z720 Tobacco use: Secondary | ICD-10-CM

## 2018-03-04 DIAGNOSIS — I10 Essential (primary) hypertension: Secondary | ICD-10-CM

## 2018-03-04 DIAGNOSIS — E669 Obesity, unspecified: Secondary | ICD-10-CM | POA: Diagnosis not present

## 2018-03-04 MED ORDER — BUPROPION HCL ER (SR) 150 MG PO TB12: 150.0000 mg | ORAL_TABLET | Freq: Two times a day (BID) | ORAL | 1 refills | Status: DC

## 2018-03-04 MED ORDER — HYDROCHLOROTHIAZIDE 12.5 MG PO TABS: 12.5000 mg | ORAL_TABLET | Freq: Every day | ORAL | 3 refills | Status: DC

## 2018-03-04 NOTE — Assessment & Plan Note (Signed)
Uncontrolled Continue amlodipine 10mg  daily Add HCTZ 12.5mg  daily Reviewed recent metabolic panel

## 2018-03-04 NOTE — Assessment & Plan Note (Signed)
Congratulated patient on quitting smoking Continue Wellbutrin at current dose for about another 6 months

## 2018-03-04 NOTE — Assessment & Plan Note (Signed)
Discussed importance of healthy weight management, diet, and exercise Discussed that replacing cigarettes with snack fold often happens and will get better with time

## 2018-03-04 NOTE — Progress Notes (Signed)
Patient: Kim Ritter Female    DOB: 02/01/1956   63 y.o.   MRN: [id number] Visit Date: 03/04/2018  Today's Provider: Lavon Paganini, MD   Chief Complaint  Patient presents with  . Hypertension  . Nicotine Dependence   Subjective:     HPI  Hypertension, follow-up:  BP Readings from Last 3 Encounters:  03/04/18 (!) 146/82  12/03/17 (!) 152/84  10/01/17 128/82    She was last seen for hypertension 3 months ago.  BP at that visit was 152/84. Management changes since that visit include no changes. She reports good compliance with treatment. She is not having side effects.  She is not exercising. She is not adherent to low salt diet.   Outside blood pressures are not being checked. She is experiencing none.  Patient denies chest pain, chest pressure/discomfort, claudication, dyspnea, exertional chest pressure/discomfort, fatigue, irregular heart beat, lower extremity edema, near-syncope, orthopnea, palpitations, paroxysmal nocturnal dyspnea, syncope and tachypnea.   Cardiovascular risk factors include dyslipidemia, hypertension, obesity (BMI >= 30 kg/m2) and smoking/ tobacco exposure.  Use of agents associated with hypertension: none.     Weight trend: stable Wt Readings from Last 3 Encounters:  03/04/18 178 lb 6.4 oz (80.9 kg)  12/03/17 174 lb 12.8 oz (79.3 kg)  10/01/17 169 lb (76.7 kg)    Current diet: well balanced  ------------------------------------------------------------------------ Smoking Cessation:  Patient presents for a 3 month follow up. Last OV was on 12/03/2017. Patient advised to continue  Bupropion SR 150 mg BID and has stopped smoking on 01/30/2018. She reports good compliance with treatment plan. She states she is currently smoking 10 cigarettes per day. She states she still has a craving with the medication.   She is worried that she has gained some weight since quitting smoking.  Plans to work on diet and exercise  No Known  Allergies   Current Outpatient Medications:  .  amLODipine (NORVASC) 10 MG tablet, Take 1 tablet (10 mg total) by mouth daily., Disp: 90 tablet, Rfl: 3 .  atorvastatin (LIPITOR) 40 MG tablet, Take 1 tablet (40 mg total) by mouth daily., Disp: 90 tablet, Rfl: 3 .  buPROPion (WELLBUTRIN SR) 150 MG 12 hr tablet, Take 1 tablet (150 mg total) by mouth 2 (two) times daily., Disp: 180 tablet, Rfl: 1 .  Multiple Vitamin (MULTIVITAMIN WITH MINERALS) TABS tablet, Take 1 tablet by mouth daily., Disp: , Rfl:  .  hydrochlorothiazide (HYDRODIURIL) 12.5 MG tablet, Take 1 tablet (12.5 mg total) by mouth daily., Disp: 30 tablet, Rfl: 3  Review of Systems  Constitutional: Negative.   Respiratory: Negative.   Cardiovascular: Negative.   Musculoskeletal: Negative.     Social History   Tobacco Use  . Smoking status: Current Every Day Smoker    Packs/day: 0.50    Years: 25.00    Pack years: 12.50    Types: Cigarettes  . Smokeless tobacco: Never Used  Substance Use Topics  . Alcohol use: No      Objective:   BP (!) 146/82 (BP Location: Left Arm, Patient Position: Sitting, Cuff Size: Normal)   Pulse 96   Temp 99.3 F (37.4 C) (Oral)   Ht 5\' 2"  (1.575 m)   Wt 178 lb 6.4 oz (80.9 kg)   SpO2 95%   BMI 32.63 kg/m  Vitals:   03/04/18 1533  BP: (!) 146/82  Pulse: 96  Temp: 99.3 F (37.4 C)  TempSrc: Oral  SpO2: 95%  Weight: 178 lb 6.4 oz (80.9  kg)  Height: 5\' 2"  (1.575 m)     Physical Exam Vitals signs reviewed.  Constitutional:      General: She is not in acute distress.    Appearance: Normal appearance. She is not diaphoretic.  HENT:     Head: Normocephalic and atraumatic.  Eyes:     General: No scleral icterus.    Conjunctiva/sclera: Conjunctivae normal.  Neck:     Musculoskeletal: Neck supple.  Cardiovascular:     Rate and Rhythm: Normal rate and regular rhythm.     Pulses: Normal pulses.     Heart sounds: Normal heart sounds. No murmur.  Pulmonary:     Effort: Pulmonary  effort is normal. No respiratory distress.     Breath sounds: Normal breath sounds. No wheezing or rhonchi.  Musculoskeletal:     Right lower leg: No edema.     Left lower leg: No edema.  Lymphadenopathy:     Cervical: No cervical adenopathy.  Skin:    General: Skin is warm and dry.     Capillary Refill: Capillary refill takes less than 2 seconds.     Findings: No rash.  Neurological:     Mental Status: She is alert and oriented to person, place, and time. Mental status is at baseline.  Psychiatric:        Mood and Affect: Mood normal.        Behavior: Behavior normal.         Assessment & Plan   Problem List Items Addressed This Visit      Cardiovascular and Mediastinum   HTN (hypertension) - Primary    Uncontrolled Continue amlodipine 10mg  daily Add HCTZ 12.5mg  daily Reviewed recent metabolic panel      Relevant Medications   hydrochlorothiazide (HYDRODIURIL) 12.5 MG tablet     Other   Obesity    Discussed importance of healthy weight management, diet, and exercise Discussed that replacing cigarettes with snack fold often happens and will get better with time      Tobacco abuse    Congratulated patient on quitting smoking Continue Wellbutrin at current dose for about another 6 months          Return in about 6 weeks (around 04/15/2018) for BP f/u.   The entirety of the information documented in the History of Present Illness, Review of Systems and Physical Exam were personally obtained by me. Portions of this information were initially documented by Tiburcio Pea, CMA and reviewed by me for thoroughness and accuracy.    Virginia Crews, MD, MPH [facility] 03/04/2018 4:58 PM

## 2018-03-04 NOTE — Patient Instructions (Signed)

## 2018-04-15 ENCOUNTER — Encounter: Payer: Self-pay | Admitting: Family Medicine

## 2018-04-15 ENCOUNTER — Ambulatory Visit: Payer: Managed Care, Other (non HMO) | Admitting: Family Medicine

## 2018-04-15 VITALS — BP 115/76 | HR 96 | Temp 98.9°F | Wt 179.0 lb

## 2018-04-15 DIAGNOSIS — E669 Obesity, unspecified: Secondary | ICD-10-CM | POA: Diagnosis not present

## 2018-04-15 DIAGNOSIS — R7303 Prediabetes: Secondary | ICD-10-CM

## 2018-04-15 DIAGNOSIS — I10 Essential (primary) hypertension: Secondary | ICD-10-CM

## 2018-04-15 DIAGNOSIS — Z720 Tobacco use: Secondary | ICD-10-CM

## 2018-04-15 DIAGNOSIS — E782 Mixed hyperlipidemia: Secondary | ICD-10-CM

## 2018-04-15 DIAGNOSIS — Z6832 Body mass index (BMI) 32.0-32.9, adult: Secondary | ICD-10-CM

## 2018-04-15 MED ORDER — NALTREXONE-BUPROPION HCL ER 8-90 MG PO TB12: ORAL_TABLET | ORAL | 0 refills | Status: DC

## 2018-04-15 MED ORDER — HYDROCHLOROTHIAZIDE 12.5 MG PO TABS: 12.5000 mg | ORAL_TABLET | Freq: Every day | ORAL | 3 refills | Status: DC

## 2018-04-15 NOTE — Assessment & Plan Note (Addendum)
Well-controlled on manual recheck Continue amlodipine 10 mg daily and HCTZ 12.5 Check CMP F/u in 6 months

## 2018-04-15 NOTE — Patient Instructions (Signed)
Bupropion; Naltrexone extended-release tablets What is this medicine? BUPROPION; NALTREXONE (byoo PROE pee on; nal TREX one) is a combination product used to promote and maintain weight loss in obese adults or overweight adults who also have weight related medical problems. This medicine should be used with a reduced calorie diet and increased physical activity. This medicine may be used for other purposes; ask your health care provider or pharmacist if you have questions. COMMON BRAND NAME(S): Contrave What should I tell my health care provider before I take this medicine? They need to know if you have any of these conditions: -an eating disorder, such as anorexia or bulimia -bipolar disorder -diabetes -depression -drug abuse or addiction -glaucoma -head injury -heart disease -high blood pressure -history of a tumor or infection of your brain or spine -history of stroke -history of irregular heartbeat -if you often drink alcohol -kidney disease -liver disease -schizophrenia -seizures -suicidal thoughts, plans, or attempt; a previous suicide attempt by you or a family member -an unusual or allergic reaction to bupropion, naltrexone, other medicines, foods, dyes, or preservatives -breast-feeding -pregnant or trying to become pregnant How should I use this medicine? Take this medicine by mouth with a glass of water. Follow the directions on the prescription label. Take this medicine in the morning and in the evenings as directed by your healthcare professional. You can take it with or without food. Do not take with high-fat meals as this may increase your risk of seizures. Do not crush, chew, or cut these tablets. Do not take your medicine more often than directed. Do not stop taking this medicine suddenly except upon the advice of your doctor. A special MedGuide will be given to you by the pharmacist with each prescription and refill. Be sure to read this information carefully each  time. Talk to your pediatrician regarding the use of this medicine in children. Special care may be needed. Overdosage: If you think you have taken too much of this medicine contact a poison control center or emergency room at once. NOTE: This medicine is only for you. Do not share this medicine with others. What if I miss a dose? If you miss a dose, skip the missed dose and take your next tablet at the regular time. Do not take double or extra doses. What may interact with this medicine? Do not take this medicine with any of the following medications: -any prescription or street opioid drug like codeine, heroin, methadone -linezolid -MAOIs like Carbex, Eldepryl, Marplan, Nardil, and Parnate -methylene blue (injected into a vein) -other medicines that contain bupropion like Zyban or Wellbutrin This medicine may also interact with the following medications: -alcohol -certain medicines for anxiety or sleep -certain medicines for blood pressure like metoprolol, propranolol -certain medicines for depression or psychotic disturbances -certain medicines for HIV or AIDS like efavirenz, lopinavir, nelfinavir, ritonavir -certain medicines for irregular heart beat like propafenone, flecainide -certain medicines for Parkinson's disease like amantadine, levodopa -certain medicines for seizures like carbamazepine, phenytoin, phenobarbital -cimetidine -clopidogrel -cyclophosphamide -digoxin -disulfiram -furazolidone -isoniazid -nicotine -orphenadrine -procarbazine -steroid medicines like prednisone or cortisone -stimulant medicines for attention disorders, weight loss, or to stay awake -tamoxifen -theophylline -thioridazine -thiotepa -ticlopidine -tramadol -warfarin This list may not describe all possible interactions. Give your health care provider a list of all the medicines, herbs, non-prescription drugs, or dietary supplements you use. Also tell them if you smoke, drink alcohol, or use  illegal drugs. Some items may interact with your medicine. What should I watch for while using this   medicine? This medicine is intended to be used in addition to a healthy diet and appropriate exercise. The best results are achieved this way. Do not increase or in any way change your dose without consulting your doctor or health care professional. Do not take this medicine with other prescription or over-the-counter weight loss products without consulting your doctor or health care professional. Your doctor should tell you to stop taking this medicine if you do not lose a certain amount of weight within the first 12 weeks of treatment. Visit your doctor or health care professional for regular checkups. Your doctor may order blood tests or other tests to see how you are doing. This medicine may affect blood sugar levels. If you have diabetes, check with your doctor or health care professional before you change your diet or the dose of your diabetic medicine. Patients and their families should watch out for new or worsening depression or thoughts of suicide. Also watch out for sudden changes in feelings such as feeling anxious, agitated, panicky, irritable, hostile, aggressive, impulsive, severely restless, overly excited and hyperactive, or not being able to sleep. If this happens, especially at the beginning of treatment or after a change in dose, call your health care professional. Avoid alcoholic drinks while taking this medicine. Drinking large amounts of alcoholic beverages, using sleeping or anxiety medicines, or quickly stopping the use of these agents while taking this medicine may increase your risk for a seizure. What side effects may I notice from receiving this medicine? Side effects that you should report to your doctor or health care professional as soon as possible: -allergic reactions like skin rash, itching or hives, swelling of the face, lips, or tongue -breathing problems -changes in  vision -confusion -elevated mood, decreased need for sleep, racing thoughts, impulsive behavior -fast or irregular heartbeat -hallucinations, loss of contact with reality -increased blood pressure -redness, blistering, peeling or loosening of the skin, including inside the mouth -seizures -signs and symptoms of liver injury like dark yellow or brown urine; general ill feeling or flu-like symptoms; light-colored stools; loss of appetite; nausea; right upper belly pain; unusually weak or tired; yellowing of the eyes or skin -suicidal thoughts or other mood changes -vomiting Side effects that usually do not require medical attention (report to your doctor or health care professional if they continue or are bothersome): -constipation -headache -loss of appetite -indigestion, stomach upset -tremors This list may not describe all possible side effects. Call your doctor for medical advice about side effects. You may report side effects to FDA at 1-800-FDA-1088. Where should I keep my medicine? Keep out of the reach of children. Store at room temperature between 15 and 30 degrees C (59 and 86 degrees F). Throw away any unused medicine after the expiration date. NOTE: This sheet is a summary. It may not cover all possible information. If you have questions about this medicine, talk to your doctor, pharmacist, or health care provider.  2019 Elsevier/Gold Standard (2015-07-16 13:42:58)  

## 2018-04-15 NOTE — Progress Notes (Signed)
Patient: Kim Ritter Female    DOB: 04-20-1955   63 y.o.   MRN: [id number] Visit Date: 04/17/2018  Today's Provider: Lavon Paganini, MD   Chief Complaint  Patient presents with  . Hypertension   Subjective:    I, Tiburcio Pea, CMA, am acting as a Education administrator for Lavon Paganini, MD.    HPI  Hypertension, follow-up:     BP Readings from Last 3 Encounters:  03/04/18 (!) 146/82  12/03/17 (!) 152/84  10/01/17 128/82    She was last seen for hypertension 6 weeks ago.  BP at that visit was 146/82. Management changes since that visit include added HCTZ 12.5 mg She reports good compliance with treatment. She is not having side effects.  She is not exercising. She is not adherent to low salt diet.   Outside blood pressures are not being checked. She is experiencing none.  Patient denies chest pain, chest pressure/discomfort, claudication, dyspnea, exertional chest pressure/discomfort, fatigue, irregular heart beat, lower extremity edema, near-syncope, orthopnea, palpitations, paroxysmal nocturnal dyspnea, syncope and tachypnea.   Cardiovascular risk factors include dyslipidemia, hypertension, obesity (BMI >= 30 kg/m2) and smoking/ tobacco exposure.  Use of agents associated with hypertension: none.                Weight trend: stable    Wt Readings from Last 3 Encounters:  03/04/18 178 lb 6.4 oz (80.9 kg)  12/03/17 174 lb 12.8 oz (79.3 kg)  10/01/17 169 lb (76.7 kg)    Current diet: well balanced Has been taking Wellbutrin to quit smoking (which she did in December) and to help with weight loss. She would really like to lose more weight.    No Known Allergies   Current Outpatient Medications:  .  amLODipine (NORVASC) 10 MG tablet, Take 1 tablet (10 mg total) by mouth daily., Disp: 90 tablet, Rfl: 3 .  atorvastatin (LIPITOR) 40 MG tablet, Take 1 tablet (40 mg total) by mouth daily., Disp: 90 tablet, Rfl: 3 .  hydrochlorothiazide (HYDRODIURIL) 12.5  MG tablet, Take 1 tablet (12.5 mg total) by mouth daily., Disp: 90 tablet, Rfl: 3 .  Multiple Vitamin (MULTIVITAMIN WITH MINERALS) TABS tablet, Take 1 tablet by mouth daily., Disp: , Rfl:  .  Naltrexone-buPROPion HCl ER 8-90 MG TB12, Take 1 tab daily x7d, then 1 tab BID x7d, then 2 tabs qAM and 1 tab qPM x7d, then 2 tabs BID, Disp: 70 tablet, Rfl: 0  Review of Systems  Constitutional: Negative.   HENT: Negative.   Respiratory: Negative.   Cardiovascular: Negative.   Musculoskeletal: Negative.   Neurological: Negative.   Psychiatric/Behavioral: Negative.     Social History   Tobacco Use  . Smoking status: Former Smoker    Packs/day: 0.50    Years: 25.00    Pack years: 12.50    Types: Cigarettes    Last attempt to quit: 01/2018    Years since quitting: 0.2  . Smokeless tobacco: Never Used  Substance Use Topics  . Alcohol use: No      Objective:   BP 115/76   Pulse 96   Temp 98.9 F (37.2 C) (Oral)   Wt 179 lb (81.2 kg)   SpO2 98%   BMI 32.74 kg/m  Vitals:   04/15/18 1519 04/15/18 1613  BP: (!) 147/87 115/76  Pulse: 96   Temp: 98.9 F (37.2 C)   TempSrc: Oral   SpO2: 98%   Weight: 179 lb (81.2 kg)  Physical Exam Vitals signs reviewed.  Constitutional:      General: She is not in acute distress.    Appearance: Normal appearance. She is not diaphoretic.  HENT:     Head: Normocephalic and atraumatic.     Mouth/Throat:     Pharynx: Oropharynx is clear.  Eyes:     General: No scleral icterus.    Conjunctiva/sclera: Conjunctivae normal.  Neck:     Musculoskeletal: Neck supple.  Cardiovascular:     Rate and Rhythm: Normal rate and regular rhythm.     Pulses: Normal pulses.     Heart sounds: Normal heart sounds. No murmur.  Pulmonary:     Effort: Pulmonary effort is normal. No respiratory distress.     Breath sounds: Normal breath sounds. No wheezing or rhonchi.  Musculoskeletal:     Right lower leg: No edema.     Left lower leg: No edema.    Lymphadenopathy:     Cervical: No cervical adenopathy.  Skin:    General: Skin is warm and dry.     Capillary Refill: Capillary refill takes less than 2 seconds.     Findings: No rash.  Neurological:     Mental Status: She is alert and oriented to person, place, and time. Mental status is at baseline.  Psychiatric:        Mood and Affect: Mood normal.        Behavior: Behavior normal.         Assessment & Plan   Problem List Items Addressed This Visit      Cardiovascular and Mediastinum   HTN (hypertension) - Primary    Well-controlled on manual recheck Continue amlodipine 10 mg daily and HCTZ 12.5 Check CMP F/u in 6 months      Relevant Medications   hydrochlorothiazide (HYDRODIURIL) 12.5 MG tablet   Other Relevant Orders   Comprehensive metabolic panel (Completed)     Other   Obesity    Discussed diet and exercise Will try Contrave instead of Wellbutrin to help with weight loss F/u in 3 months      Relevant Medications   Naltrexone-buPROPion HCl ER 8-90 MG TB12   Tobacco abuse    Congratulated her on quitting Continue wellbutrin in Contrave      Hyperlipidemia    Continue Statin Recheck FLP and CMP      Relevant Medications   hydrochlorothiazide (HYDRODIURIL) 12.5 MG tablet   Other Relevant Orders   Lipid panel (Completed)   Comprehensive metabolic panel (Completed)   Prediabetes    Recheck A1c Discussed diet and exercise      Relevant Orders   Hemoglobin A1c (Completed)       Return in about 3 months (around 07/16/2018) for weight f/u.   The entirety of the information documented in the History of Present Illness, Review of Systems and Physical Exam were personally obtained by me. Portions of this information were initially documented by Tiburcio Pea, CMA and reviewed by me for thoroughness and accuracy.    Virginia Crews, MD, MPH [facility] 04/17/2018 5:10 PM

## 2018-04-16 LAB — COMPREHENSIVE METABOLIC PANEL
ALBUMIN: 4.3 g/dL (ref 3.8–4.8)
ALK PHOS: 113 IU/L (ref 39–117)
ALT: 28 IU/L (ref 0–32)
AST: 27 IU/L (ref 0–40)
Albumin/Globulin Ratio: 1.6 (ref 1.2–2.2)
BILIRUBIN TOTAL: 0.3 mg/dL (ref 0.0–1.2)
BUN / CREAT RATIO: 12 (ref 12–28)
BUN: 11 mg/dL (ref 8–27)
CHLORIDE: 101 mmol/L (ref 96–106)
CO2: 23 mmol/L (ref 20–29)
Calcium: 10 mg/dL (ref 8.7–10.3)
Creatinine, Ser: 0.9 mg/dL (ref 0.57–1.00)
GFR calc Af Amer: 79 mL/min/{1.73_m2} (ref >59)
GFR calc non Af Amer: 68 mL/min/{1.73_m2} (ref >59)
GLOBULIN, TOTAL: 2.7 g/dL (ref 1.5–4.5)
GLUCOSE: 71 mg/dL (ref 65–99)
Potassium: 3.5 mmol/L (ref 3.5–5.2)
Sodium: 143 mmol/L (ref 134–144)
TOTAL PROTEIN: 7 g/dL (ref 6.0–8.5)

## 2018-04-16 LAB — LIPID PANEL
CHOLESTEROL TOTAL: 153 mg/dL (ref 100–199)
Chol/HDL Ratio: 4.3 ratio (ref 0.0–4.4)
HDL: 36 mg/dL — AB (ref >39)
LDL Calculated: 74 mg/dL (ref 0–99)
Triglycerides: 214 mg/dL — ABNORMAL HIGH (ref 0–149)
VLDL Cholesterol Cal: 43 mg/dL — ABNORMAL HIGH (ref 5–40)

## 2018-04-16 LAB — HEMOGLOBIN A1C
ESTIMATED AVERAGE GLUCOSE: 117 mg/dL
HEMOGLOBIN A1C: 5.7 % — AB (ref 4.8–5.6)

## 2018-04-17 NOTE — Assessment & Plan Note (Signed)
Congratulated her on quitting Continue wellbutrin in [place]

## 2018-04-17 NOTE — Assessment & Plan Note (Signed)
Recheck A1c Discussed diet and exercise

## 2018-04-17 NOTE — Assessment & Plan Note (Signed)
Continue Statin Recheck FLP and CMP

## 2018-04-17 NOTE — Assessment & Plan Note (Signed)
Discussed diet and exercise Will try Contrave instead of Wellbutrin to help with weight loss F/u in 3 months

## 2018-04-27 ENCOUNTER — Encounter: Payer: Self-pay | Admitting: Family Medicine

## 2018-07-18 ENCOUNTER — Encounter: Payer: Self-pay | Admitting: Family Medicine

## 2018-07-18 ENCOUNTER — Encounter: Payer: Managed Care, Other (non HMO) | Admitting: Family Medicine

## 2018-07-18 NOTE — Progress Notes (Signed)
Patient canceled appt as she is not taking the medication that we were following up on.   This encounter was created in error - please disregard.

## 2018-11-13 ENCOUNTER — Encounter: Payer: Self-pay | Admitting: Family Medicine

## 2018-11-15 ENCOUNTER — Ambulatory Visit: Payer: Managed Care, Other (non HMO) | Admitting: Family Medicine

## 2018-11-19 ENCOUNTER — Other Ambulatory Visit: Payer: Self-pay

## 2018-11-19 ENCOUNTER — Ambulatory Visit (INDEPENDENT_AMBULATORY_CARE_PROVIDER_SITE_OTHER): Payer: Managed Care, Other (non HMO)

## 2018-11-19 DIAGNOSIS — Z23 Encounter for immunization: Secondary | ICD-10-CM

## 2018-12-25 ENCOUNTER — Encounter: Payer: Self-pay | Admitting: Family Medicine

## 2018-12-26 ENCOUNTER — Other Ambulatory Visit: Payer: Self-pay

## 2018-12-26 MED ORDER — AMLODIPINE BESYLATE 10 MG PO TABS: 10.0000 mg | ORAL_TABLET | Freq: Every day | ORAL | 0 refills | Status: DC

## 2018-12-26 MED ORDER — ATORVASTATIN CALCIUM 40 MG PO TABS: 40.0000 mg | ORAL_TABLET | Freq: Every day | ORAL | 0 refills | Status: DC

## 2018-12-26 NOTE — Telephone Encounter (Signed)
Go ahead and refill and call and schedule f/u appt please

## 2018-12-26 NOTE — Telephone Encounter (Signed)
Last ov 04/15/2018, was to follow up in 6 mths. Please advise.

## 2019-03-18 ENCOUNTER — Other Ambulatory Visit: Payer: Self-pay | Admitting: Family Medicine

## 2019-03-18 NOTE — Telephone Encounter (Signed)
Requested medication (s) are due for refill today: no  Requested medication (s) are on the active medication list: yes  Last refill:  12/17/2018  Future visit scheduled: no  Notes to clinic:  no valid encounter within last 6 months   Requested Prescriptions  Pending Prescriptions Disp Refills   hydrochlorothiazide (HYDRODIURIL) 12.5 MG tablet [Pharmacy Med Name: HYDROCHLOROTHIAZIDE 12.5 MG TB] 90 tablet 3    Sig: TAKE 1 TABLET BY MOUTH EVERY DAY      Cardiovascular: Diuretics - Thiazide Failed - 03/18/2019  1:41 AM      Failed - Valid encounter within last 6 months    Recent Outpatient Visits           11 months ago Essential hypertension   [facility] [place], Dionne Bucy, MD   1 year ago Essential hypertension   [facility] [place], Dionne Bucy, MD   1 year ago Tobacco abuse   [facility] [place], Dionne Bucy, MD   1 year ago Essential hypertension   [place], Dionne Bucy, MD   1 year ago Essential hypertension   [place], Dionne Bucy, MD              Passed - Ca in normal range and within 360 days    Calcium  Date Value Ref Range Status  04/15/2018 10.0 8.7 - 10.3 mg/dL Final          Passed - Cr in normal range and within 360 days    Creatinine, Ser  Date Value Ref Range Status  04/15/2018 0.90 0.57 - 1.00 mg/dL Final          Passed - K in normal range and within 360 days    Potassium  Date Value Ref Range Status  04/15/2018 3.5 3.5 - 5.2 mmol/L Final          Passed - Na in normal range and within 360 days    Sodium  Date Value Ref Range Status  04/15/2018 143 134 - 144 mmol/L Final          Passed - Last BP in normal range    BP Readings from Last 1 Encounters:  04/15/18 115/76

## 2019-03-21 ENCOUNTER — Other Ambulatory Visit: Payer: Self-pay | Admitting: Family Medicine

## 2019-04-15 ENCOUNTER — Other Ambulatory Visit: Payer: Self-pay | Admitting: Family Medicine

## 2019-04-22 ENCOUNTER — Encounter: Payer: Self-pay | Admitting: Family Medicine

## 2019-04-22 ENCOUNTER — Other Ambulatory Visit: Payer: Self-pay

## 2019-04-22 ENCOUNTER — Ambulatory Visit: Payer: Managed Care, Other (non HMO) | Admitting: Family Medicine

## 2019-04-22 VITALS — BP 121/74 | HR 82 | Temp 97.1°F | Resp 16 | Ht 62.0 in | Wt 178.4 lb

## 2019-04-22 DIAGNOSIS — Z87891 Personal history of nicotine dependence: Secondary | ICD-10-CM | POA: Diagnosis not present

## 2019-04-22 DIAGNOSIS — I10 Essential (primary) hypertension: Secondary | ICD-10-CM | POA: Diagnosis not present

## 2019-04-22 DIAGNOSIS — E782 Mixed hyperlipidemia: Secondary | ICD-10-CM

## 2019-04-22 DIAGNOSIS — D582 Other hemoglobinopathies: Secondary | ICD-10-CM

## 2019-04-22 DIAGNOSIS — Z6832 Body mass index (BMI) 32.0-32.9, adult: Secondary | ICD-10-CM

## 2019-04-22 DIAGNOSIS — E669 Obesity, unspecified: Secondary | ICD-10-CM

## 2019-04-22 DIAGNOSIS — Z23 Encounter for immunization: Secondary | ICD-10-CM

## 2019-04-22 DIAGNOSIS — R7303 Prediabetes: Secondary | ICD-10-CM

## 2019-04-22 NOTE — Assessment & Plan Note (Signed)
History of  Likely due to smoking history Recheck CBC

## 2019-04-22 NOTE — Progress Notes (Signed)
Patient: Kim Ritter Female    DOB: 10-Nov-1955   64 y.o.   MRN: [id number] Visit Date: 04/22/2019  Today's Provider: Lavon Paganini, MD   Chief Complaint  Patient presents with  . Hypertension  . Hyperlipidemia  . Follow-up   Subjective:     HPI  Hypertension, follow-up:  BP Readings from Last 3 Encounters:  04/22/19 121/74  04/15/18 115/76  03/04/18 (!) 146/82    She was last seen for hypertension 1 years ago.  BP at that visit was 115/76.  Management changes since that visit include Continue amlodipine 10 mg daily and HCTZ 12.5. She reports excellent compliance with treatment. She is not having side effects.  She is not exercising. She is adherent to low salt diet.   Outside blood pressures are not being checked. She is experiencing lower extremity edema.  Patient denies chest pain.   Cardiovascular risk factors include hypertension, microalbuminuria and smoking/ tobacco exposure.  Use of agents associated with hypertension: none.     Weight trend: stable Wt Readings from Last 3 Encounters:  04/22/19 178 lb 6.4 oz (80.9 kg)  04/15/18 179 lb (81.2 kg)  03/04/18 178 lb 6.4 oz (80.9 kg)    Current diet: in general, a "healthy" diet    ------------------------------------------------------------------------   Lipid/Cholesterol, Follow-up:   Last seen for this1 years ago.  Management changes since that visit include no changes, continue medication. Last Lipid Panel:    Component Value Date/Time   CHOL 153 04/15/2018 1629   TRIG 214 (H) 04/15/2018 1629   HDL 36 (L) 04/15/2018 1629   CHOLHDL 4.3 04/15/2018 1629   Petros 74 04/15/2018 1629    Risk factors for vascular disease include hypercholesterolemia, hypertension and smoking  She reports excellent compliance with treatment. She is not having side effects.  Current symptoms include none and have been stable. Weight trend: stable Prior visit with dietician: no Current diet: in  general, a "healthy" diet   Current exercise: none  Wt Readings from Last 3 Encounters:  04/22/19 178 lb 6.4 oz (80.9 kg)  04/15/18 179 lb (81.2 kg)  03/04/18 178 lb 6.4 oz (80.9 kg)    -------------------------------------------------------------------  Follow up for Obesity  The patient was last seen for this 1 years ago. Changes made at last visit include start Contrave instead of Wellbutrin.  She reports she was not able to afford medication. She feels that condition is Unchanged. She is not having side effects.   ------------------------------------------------------------------------------------   No Known Allergies   Current Outpatient Medications:  .  amLODipine (NORVASC) 10 MG tablet, TAKE 1 TABLET BY MOUTH EVERY DAY, Disp: 30 tablet, Rfl: 0 .  ascorbic acid (VITAMIN C) 500 MG tablet, , Disp: , Rfl:  .  atorvastatin (LIPITOR) 40 MG tablet, TAKE 1 TABLET BY MOUTH EVERY DAY, Disp: 30 tablet, Rfl: 0 .  Biotin 10 MG TABS, , Disp: , Rfl:  .  Cholecalciferol (VITAMIN D3) 50 MCG (2000 UT) TABS, , Disp: , Rfl:  .  hydrochlorothiazide (HYDRODIURIL) 12.5 MG tablet, Take 1 tablet (12.5 mg total) by mouth daily. Please schedule office visit before any future refills., Disp: 30 tablet, Rfl: 0 .  Multiple Vitamin (MULTIVITAMIN WITH MINERALS) TABS tablet, Take 1 tablet by mouth daily., Disp: , Rfl:  .  ranitidine (ACID REDUCER) 150 MG tablet, , Disp: , Rfl:  .  Zinc Sulfate 220 (50 Zn) MG TABS, , Disp: , Rfl:   Review of Systems  Constitutional: Negative.  Eyes: Negative.   Respiratory: Negative.   Cardiovascular: Positive for leg swelling. Negative for chest pain and palpitations.    Social History   Tobacco Use  . Smoking status: Former Smoker    Packs/day: 0.50    Years: 25.00    Pack years: 12.50    Types: Cigarettes    Quit date: 01/2018    Years since quitting: 1.2  . Smokeless tobacco: Never Used  Substance Use Topics  . Alcohol use: No      Objective:    BP 121/74 (BP Location: Left Arm, Patient Position: Sitting, Cuff Size: Large)   Pulse 82   Temp (!) 97.1 F (36.2 C) (Temporal)   Resp 16   Ht 5\' 2"  (1.575 m)   Wt 178 lb 6.4 oz (80.9 kg)   BMI 32.63 kg/m  Vitals:   04/22/19 0830  BP: 121/74  Pulse: 82  Resp: 16  Temp: (!) 97.1 F (36.2 C)  TempSrc: Temporal  Weight: 178 lb 6.4 oz (80.9 kg)  Height: 5\' 2"  (1.575 m)  Body mass index is 32.63 kg/m.   Physical Exam Vitals reviewed.  Constitutional:      General: She is not in acute distress.    Appearance: Normal appearance. She is well-developed. She is not diaphoretic.  HENT:     Head: Normocephalic and atraumatic.  Eyes:     General: No scleral icterus.    Conjunctiva/sclera: Conjunctivae normal.  Neck:     Thyroid: No thyromegaly.  Cardiovascular:     Rate and Rhythm: Normal rate and regular rhythm.     Pulses: Normal pulses.     Heart sounds: Normal heart sounds. No murmur.  Pulmonary:     Effort: Pulmonary effort is normal. No respiratory distress.     Breath sounds: Normal breath sounds. No wheezing, rhonchi or rales.  Musculoskeletal:     Cervical back: Neck supple.     Right lower leg: No edema.     Left lower leg: No edema.  Lymphadenopathy:     Cervical: No cervical adenopathy.  Skin:    General: Skin is warm and dry.     Findings: No rash.  Neurological:     Mental Status: She is alert and oriented to person, place, and time. Mental status is at baseline.  Psychiatric:        Mood and Affect: Mood normal.        Behavior: Behavior normal.      No results found for any visits on 04/22/19.     Assessment & Plan    Problem List Items Addressed This Visit      Cardiovascular and Mediastinum   HTN (hypertension) - Primary    Well controlled Continue current medications Recheck metabolic panel F/u in 6 months       Relevant Orders   Comprehensive metabolic panel     Other   Obesity    Discussed importance of healthy weight  management Discussed diet and exercise       Relevant Orders   Lipid panel   Hemoglobin A1c   Comprehensive metabolic panel   CBC w/Diff/Platelet   History of tobacco use disorder    Continues to abstain from smoking since 01/2018      Hyperlipidemia    Continue statin Recheck FLP and CMP      Relevant Orders   Lipid panel   Hemoglobin A1c   Elevated hemoglobin (HCC)    History of  Likely due to smoking history  Recheck CBC      Relevant Orders   CBC w/Diff/Platelet   Prediabetes    Encourage low carb diet  Recheck A1c      Relevant Orders   Hemoglobin A1c       Return in about 6 months (around 10/23/2019) for chronic disease f/u.   The entirety of the information documented in the History of Present Illness, Review of Systems and Physical Exam were personally obtained by me. Portions of this information were initially documented by Lynford Humphrey , CMA and reviewed by me for thoroughness and accuracy.    Adalae Baysinger, Dionne Bucy, MD MPH [facility]

## 2019-04-22 NOTE — Patient Instructions (Signed)
Preventive Care 64 Years Old, Female Preventive care refers to visits with your health care provider and lifestyle choices that can promote health and wellness. This includes:  A yearly physical exam. This may also be called an annual well check.  Regular dental visits and eye exams.  Immunizations.  Screening for certain conditions.  Healthy lifestyle choices, such as eating a healthy diet, getting regular exercise, not using drugs or products that contain nicotine and tobacco, and limiting alcohol use. What can I expect for my preventive care visit? Physical exam Your health care provider will check your:  Height and weight. This may be used to calculate body mass index (BMI), which tells if you are at a healthy weight.  Heart rate and blood pressure.  Skin for abnormal spots. Counseling Your health care provider may ask you questions about your:  Alcohol, tobacco, and drug use.  Emotional well-being.  Home and relationship well-being.  Sexual activity.  Eating habits.  Work and work environment.  Method of birth control.  Menstrual cycle.  Pregnancy history. What immunizations do I need?  Influenza (flu) vaccine  This is recommended every year. Tetanus, diphtheria, and pertussis (Tdap) vaccine  You may need a Td booster every 10 years. Varicella (chickenpox) vaccine  You may need this if you have not been vaccinated. Zoster (shingles) vaccine  You may need this after age 65. Measles, mumps, and rubella (MMR) vaccine  You may need at least one dose of MMR if you were born in 1957 or later. You may also need a second dose. Pneumococcal conjugate (PCV13) vaccine  You may need this if you have certain conditions and were not previously vaccinated. Pneumococcal polysaccharide (PPSV23) vaccine  You may need one or two doses if you smoke cigarettes or if you have certain conditions. Meningococcal conjugate (MenACWY) vaccine  You may need this if you  have certain conditions. Hepatitis A vaccine  You may need this if you have certain conditions or if you travel or work in places where you may be exposed to hepatitis A. Hepatitis B vaccine  You may need this if you have certain conditions or if you travel or work in places where you may be exposed to hepatitis B. Haemophilus influenzae type b (Hib) vaccine  You may need this if you have certain conditions. Human papillomavirus (HPV) vaccine  If recommended by your health care provider, you may need three doses over 6 months. You may receive vaccines as individual doses or as more than one vaccine together in one shot (combination vaccines). Talk with your health care provider about the risks and benefits of combination vaccines. What tests do I need? Blood tests  Lipid and cholesterol levels. These may be checked every 5 years, or more frequently if you are over 50 years old.  Hepatitis C test.  Hepatitis B test. Screening  Lung cancer screening. You may have this screening every year starting at age 65 if you have a 30-pack-year history of smoking and currently smoke or have quit within the past 15 years.  Colorectal cancer screening. All adults should have this screening starting at age 65 and continuing until age 75. Your health care provider may recommend screening at age 65 if you are at increased risk. You will have tests every 1-10 years, depending on your results and the type of screening test.  Diabetes screening. This is done by checking your blood sugar (glucose) after you have not eaten for a while (fasting). You may have this   done every 1-3 years.  Mammogram. This may be done every 1-2 years. Talk with your health care provider about when you should start having regular mammograms. This may depend on whether you have a family history of breast cancer.  BRCA-related cancer screening. This may be done if you have a family history of breast, ovarian, tubal, or peritoneal  cancers.  Pelvic exam and Pap test. This may be done every 3 years starting at age 65. Starting at age 65, this may be done every 5 years if you have a Pap test in combination with an HPV test. Other tests  Sexually transmitted disease (STD) testing.  Bone density scan. This is done to screen for osteoporosis. You may have this scan if you are at high risk for osteoporosis. Follow these instructions at home: Eating and drinking  Eat a diet that includes fresh fruits and vegetables, whole grains, lean protein, and low-fat dairy.  Take vitamin and mineral supplements as recommended by your health care provider.  Do not drink alcohol if: ? Your health care provider tells you not to drink. ? You are pregnant, may be pregnant, or are planning to become pregnant.  If you drink alcohol: ? Limit how much you have to 0-1 drink a day. ? Be aware of how much alcohol is in your drink. In the [place] one drink equals one 12 oz bottle of beer (355 mL), one 5 oz glass of wine (148 mL), or one 1 oz glass of hard liquor (44 mL). Lifestyle  Take daily care of your teeth and gums.  Stay active. Exercise for at least 30 minutes on 5 or more days each week.  Do not use any products that contain nicotine or tobacco, such as cigarettes, e-cigarettes, and chewing tobacco. If you need help quitting, ask your health care provider.  If you are sexually active, practice safe sex. Use a condom or other form of birth control (contraception) in order to prevent pregnancy and STIs (sexually transmitted infections).  If told by your health care provider, take low-dose aspirin daily starting at age 65. What's next?  Visit your health care provider once a year for a well check visit.  Ask your health care provider how often you should have your eyes and teeth checked.  Stay up to date on all vaccines. This information is not intended to replace advice given to you by your health care provider. Make sure you  discuss any questions you have with your health care provider. Document Revised: 10/04/2017 Document Reviewed: 10/04/2017 Elsevier Patient Education  2020 [employer].

## 2019-04-22 NOTE — Assessment & Plan Note (Signed)
Continues to abstain from smoking since 01/2018

## 2019-04-22 NOTE — Assessment & Plan Note (Signed)
Well controlled Continue current medications Recheck metabolic panel F/u in 6 months  

## 2019-04-22 NOTE — Assessment & Plan Note (Signed)
Continue statin Recheck FLP and CMP

## 2019-04-22 NOTE — Assessment & Plan Note (Signed)
Encourage low carb diet Recheck A1c 

## 2019-04-22 NOTE — Assessment & Plan Note (Signed)
Discussed importance of healthy weight management Discussed diet and exercise  

## 2019-04-22 NOTE — Addendum Note (Signed)
Addended by: Shawna Orleans on: 04/22/2019 01:28 PM   Modules accepted: Orders

## 2019-04-23 ENCOUNTER — Telehealth: Payer: Self-pay

## 2019-04-23 LAB — HEMOGLOBIN A1C
Est. average glucose Bld gHb Est-mCnc: 126 mg/dL
Hgb A1c MFr Bld: 6 % — ABNORMAL HIGH (ref 4.8–5.6)

## 2019-04-23 LAB — CBC WITH DIFFERENTIAL/PLATELET
Basophils Absolute: 0 10*3/uL (ref 0.0–0.2)
Basos: 0 %
EOS (ABSOLUTE): 0.2 10*3/uL (ref 0.0–0.4)
Eos: 2 %
Hematocrit: 47 % — ABNORMAL HIGH (ref 34.0–46.6)
Hemoglobin: 16.1 g/dL — ABNORMAL HIGH (ref 11.1–15.9)
Immature Grans (Abs): 0 10*3/uL (ref 0.0–0.1)
Immature Granulocytes: 0 %
Lymphocytes Absolute: 2.8 10*3/uL (ref 0.7–3.1)
Lymphs: 31 %
MCH: 28.9 pg (ref 26.6–33.0)
MCHC: 34.3 g/dL (ref 31.5–35.7)
MCV: 84 fL (ref 79–97)
Monocytes Absolute: 0.7 10*3/uL (ref 0.1–0.9)
Monocytes: 7 %
Neutrophils Absolute: 5.4 10*3/uL (ref 1.4–7.0)
Neutrophils: 60 %
Platelets: 232 10*3/uL (ref 150–450)
RBC: 5.58 x10E6/uL — ABNORMAL HIGH (ref 3.77–5.28)
RDW: 12.8 % (ref 11.7–15.4)
WBC: 9 10*3/uL (ref 3.4–10.8)

## 2019-04-23 LAB — COMPREHENSIVE METABOLIC PANEL
ALT: 28 IU/L (ref 0–32)
AST: 25 IU/L (ref 0–40)
Albumin/Globulin Ratio: 1.4 (ref 1.2–2.2)
Albumin: 4.2 g/dL (ref 3.8–4.8)
Alkaline Phosphatase: 108 IU/L (ref 39–117)
BUN/Creatinine Ratio: 13 (ref 12–28)
BUN: 9 mg/dL (ref 8–27)
Bilirubin Total: 0.4 mg/dL (ref 0.0–1.2)
CO2: 26 mmol/L (ref 20–29)
Calcium: 10 mg/dL (ref 8.7–10.3)
Chloride: 101 mmol/L (ref 96–106)
Creatinine, Ser: 0.71 mg/dL (ref 0.57–1.00)
GFR calc Af Amer: 104 mL/min/{1.73_m2} (ref >59)
GFR calc non Af Amer: 90 mL/min/{1.73_m2} (ref >59)
Globulin, Total: 2.9 g/dL (ref 1.5–4.5)
Glucose: 91 mg/dL (ref 65–99)
Potassium: 4 mmol/L (ref 3.5–5.2)
Sodium: 141 mmol/L (ref 134–144)
Total Protein: 7.1 g/dL (ref 6.0–8.5)

## 2019-04-23 LAB — LIPID PANEL
Chol/HDL Ratio: 4.3 ratio (ref 0.0–4.4)
Cholesterol, Total: 147 mg/dL (ref 100–199)
HDL: 34 mg/dL — ABNORMAL LOW (ref >39)
LDL Chol Calc (NIH): 76 mg/dL (ref 0–99)
Triglycerides: 225 mg/dL — ABNORMAL HIGH (ref 0–149)
VLDL Cholesterol Cal: 37 mg/dL (ref 5–40)

## 2019-04-23 NOTE — Telephone Encounter (Signed)
Pt advised.   Thanks,   -Dagoberto Nealy  

## 2019-04-23 NOTE — Telephone Encounter (Signed)
-----   Message from Virginia Crews, MD sent at 04/23/2019  8:01 AM EDT ----- Normal labs, except hemoglobin A1c is elevated in the prediabetic range.  It has worsened since it was last checked.  Recommend low-carb diet.

## 2019-05-24 ENCOUNTER — Other Ambulatory Visit: Payer: Self-pay | Admitting: Family Medicine

## 2019-05-24 NOTE — Telephone Encounter (Signed)
Requested Prescriptions  Pending Prescriptions Disp Refills  . hydrochlorothiazide (HYDRODIURIL) 12.5 MG tablet [Pharmacy Med Name: HYDROCHLOROTHIAZIDE 12.5 MG TB] 90 tablet 1    Sig: TAKE 1 TABLET BY MOUTH DAILY. PLEASE SCHEDULE OFFICE VISIT BEFORE ANY FUTURE REFILLS.     Cardiovascular: Diuretics - Thiazide Passed - 05/24/2019  5:10 PM      Passed - Ca in normal range and within 360 days    Calcium  Date Value Ref Range Status  04/22/2019 10.0 8.7 - 10.3 mg/dL Final         Passed - Cr in normal range and within 360 days    Creatinine, Ser  Date Value Ref Range Status  04/22/2019 0.71 0.57 - 1.00 mg/dL Final         Passed - K in normal range and within 360 days    Potassium  Date Value Ref Range Status  04/22/2019 4.0 3.5 - 5.2 mmol/L Final         Passed - Na in normal range and within 360 days    Sodium  Date Value Ref Range Status  04/22/2019 141 134 - 144 mmol/L Final         Passed - Last BP in normal range    BP Readings from Last 1 Encounters:  04/22/19 121/74         Passed - Valid encounter within last 6 months    Recent Outpatient Visits          1 month ago Essential hypertension   [facility] [place], Dionne Bucy, MD   1 year ago Essential hypertension   [place], Dionne Bucy, MD   1 year ago Essential hypertension   [place], Dionne Bucy, MD   1 year ago Tobacco abuse   [facility] [place], Dionne Bucy, MD   1 year ago Essential hypertension   [place], Dionne Bucy, MD      Future Appointments            In 5 months [place], Dionne Bucy, MD [facility]

## 2019-07-25 ENCOUNTER — Other Ambulatory Visit: Payer: Self-pay | Admitting: Family Medicine

## 2019-08-08 ENCOUNTER — Other Ambulatory Visit: Payer: Self-pay | Admitting: Family Medicine

## 2019-08-08 MED ORDER — AMLODIPINE BESYLATE 10 MG PO TABS: 10.0000 mg | ORAL_TABLET | Freq: Every day | ORAL | 0 refills | Status: DC

## 2019-08-08 NOTE — Telephone Encounter (Signed)
Requested Prescriptions  Pending Prescriptions Disp Refills  . amLODipine (NORVASC) 10 MG tablet 30 tablet 0    Sig: Take 1 tablet (10 mg total) by mouth daily.     Cardiovascular:  Calcium Channel Blockers Passed - 08/08/2019 10:49 AM      Passed - Last BP in normal range    BP Readings from Last 1 Encounters:  04/22/19 121/74         Passed - Valid encounter within last 6 months    Recent Outpatient Visits          3 months ago Essential hypertension   [facility] [place], Dionne Bucy, MD   1 year ago Essential hypertension   [place], Dionne Bucy, MD   1 year ago Essential hypertension   [place], Dionne Bucy, MD   1 year ago Tobacco abuse   [facility] [place], Dionne Bucy, MD   1 year ago Essential hypertension   [place], Dionne Bucy, MD      Future Appointments            In 2 months Bacigalupo, Dionne Bucy, MD [facility], [place]

## 2019-08-08 NOTE — Telephone Encounter (Signed)
Relation to pt: self  Call back number: [phone] [id number] [place], [place] Phone:  [phone]  Fax:  [phone]        Reason for call:  Patient requesting amLODipine (NORVASC) 10 MG tablet, informed please allow 48 to 72 hour turn around time

## 2019-08-16 ENCOUNTER — Other Ambulatory Visit: Payer: Self-pay | Admitting: Family Medicine

## 2019-08-16 NOTE — Telephone Encounter (Signed)
Requested Prescriptions  Pending Prescriptions Disp Refills  . atorvastatin (LIPITOR) 40 MG tablet [Pharmacy Med Name: ATORVASTATIN 40 MG TABLET] 90 tablet 0    Sig: TAKE 1 TABLET BY MOUTH EVERY DAY     Cardiovascular:  Antilipid - Statins Failed - 08/16/2019 12:30 PM      Failed - LDL in normal range and within 360 days    LDL Chol Calc (NIH)  Date Value Ref Range Status  04/22/2019 76 0 - 99 mg/dL Final         Failed - HDL in normal range and within 360 days    HDL  Date Value Ref Range Status  04/22/2019 34 (L) >39 mg/dL Final         Failed - Triglycerides in normal range and within 360 days    Triglycerides  Date Value Ref Range Status  04/22/2019 225 (H) 0 - 149 mg/dL Final         Passed - Total Cholesterol in normal range and within 360 days    Cholesterol, Total  Date Value Ref Range Status  04/22/2019 147 100 - 199 mg/dL Final         Passed - Patient is not pregnant      Passed - Valid encounter within last 12 months    Recent Outpatient Visits          3 months ago Essential hypertension   [place], Dionne Bucy, MD   1 year ago Essential hypertension   [place], Dionne Bucy, MD   1 year ago Essential hypertension   [place], Dionne Bucy, MD   1 year ago Tobacco abuse   [facility] [place], Dionne Bucy, MD   1 year ago Essential hypertension   [place], Dionne Bucy, MD      Future Appointments            In 2 months Bacigalupo, Dionne Bucy, MD [facility], [place]

## 2019-09-06 ENCOUNTER — Other Ambulatory Visit: Payer: Self-pay | Admitting: Family Medicine

## 2019-09-06 NOTE — Telephone Encounter (Signed)
Requested Prescriptions  Pending Prescriptions Disp Refills   amLODipine (NORVASC) 10 MG tablet [Pharmacy Med Name: AMLODIPINE BESYLATE 10 MG TAB] 51 tablet 0    Sig: TAKE 1 TABLET BY MOUTH EVERY DAY     Cardiovascular:  Calcium Channel Blockers Passed - 09/06/2019 10:05 AM      Passed - Last BP in normal range    BP Readings from Last 1 Encounters:  04/22/19 121/74         Passed - Valid encounter within last 6 months    Recent Outpatient Visits          4 months ago Essential hypertension   [facility] [place], Dionne Bucy, MD   1 year ago Essential hypertension   [facility] [place], Dionne Bucy, MD   1 year ago Essential hypertension   [place], Dionne Bucy, MD   1 year ago Tobacco abuse   [facility] [place], Dionne Bucy, MD   1 year ago Essential hypertension   [place], Dionne Bucy, MD      Future Appointments            In 1 month Bacigalupo, Dionne Bucy, MD [facility]           '

## 2019-09-29 ENCOUNTER — Encounter: Payer: Self-pay | Admitting: Family Medicine

## 2019-10-26 ENCOUNTER — Other Ambulatory Visit: Payer: Self-pay | Admitting: Family Medicine

## 2019-10-26 NOTE — Telephone Encounter (Signed)
Requested medications are due for refill today? Yes  Requested medications are on active medication list?  Yes  Last Refill:  09/06/2019  # 51 with no refill.  Reminder for patient to keep upcoming appt for further refills.  Enough medication provided to get patient to her next appt.    Future visit scheduled?  No   Notes to Clinic:  Please see above.  No valid encounter in the past 6 months.  Patient has cancelled appoint that was scheduled for 10/27/2019.

## 2019-10-27 ENCOUNTER — Ambulatory Visit: Payer: Self-pay | Admitting: Family Medicine

## 2019-11-16 ENCOUNTER — Other Ambulatory Visit: Payer: Self-pay | Admitting: Family Medicine

## 2019-11-16 NOTE — Telephone Encounter (Signed)
Requested Prescriptions  Pending Prescriptions Disp Refills  . atorvastatin (LIPITOR) 40 MG tablet [Pharmacy Med Name: ATORVASTATIN 40 MG TABLET] 90 tablet 0    Sig: TAKE 1 TABLET BY MOUTH EVERY DAY     Cardiovascular:  Antilipid - Statins Failed - 11/16/2019  1:35 AM      Failed - LDL in normal range and within 360 days    LDL Chol Calc (NIH)  Date Value Ref Range Status  04/22/2019 76 0 - 99 mg/dL Final         Failed - HDL in normal range and within 360 days    HDL  Date Value Ref Range Status  04/22/2019 34 (L) >39 mg/dL Final         Failed - Triglycerides in normal range and within 360 days    Triglycerides  Date Value Ref Range Status  04/22/2019 225 (H) 0 - 149 mg/dL Final         Passed - Total Cholesterol in normal range and within 360 days    Cholesterol, Total  Date Value Ref Range Status  04/22/2019 147 100 - 199 mg/dL Final         Passed - Patient is not pregnant      Passed - Valid encounter within last 12 months    Recent Outpatient Visits          6 months ago Essential hypertension   [place], Dionne Bucy, MD   1 year ago Essential hypertension   [place], Dionne Bucy, MD   1 year ago Essential hypertension   [place], Dionne Bucy, MD   1 year ago Tobacco abuse   [place], Dionne Bucy, MD   2 years ago Essential hypertension   [facility] [place], Dionne Bucy, MD

## 2019-11-25 ENCOUNTER — Other Ambulatory Visit: Payer: Self-pay | Admitting: Family Medicine

## 2019-12-08 ENCOUNTER — Other Ambulatory Visit: Payer: Self-pay | Admitting: Family Medicine

## 2019-12-08 NOTE — Telephone Encounter (Signed)
Noted [facility] [address] [place].  Phone call to pt.  Left vm. that the requested medication had been ordered on 11/16/19, at her pharmacy, and to call office if any further questions.

## 2019-12-08 NOTE — Telephone Encounter (Signed)
Medication: atorvastatin (LIPITOR) 40 MG tablet [[id number]]   Has the patient contacted their pharmacy? YES  (Agent: If no, request that the patient contact the pharmacy for the refill.) (Agent: If yes, when and what did the pharmacy advise?)  Preferred Pharmacy (with phone number or street name): [id number] [place] [address] [place] [address] Phone: [phone] Fax: [phone] Hours: Not open 24 hours    Agent: Please be advised that RX refills may take up to 3 business days. We ask that you follow-up with your pharmacy.

## 2020-02-04 ENCOUNTER — Other Ambulatory Visit: Payer: Self-pay | Admitting: Family Medicine

## 2020-02-28 ENCOUNTER — Other Ambulatory Visit: Payer: Self-pay | Admitting: Family Medicine

## 2020-02-29 NOTE — Telephone Encounter (Signed)
Courtesy refill until appt; . Pt has appt 03/12/20

## 2020-03-12 ENCOUNTER — Ambulatory Visit (INDEPENDENT_AMBULATORY_CARE_PROVIDER_SITE_OTHER): Payer: PPO | Admitting: Family Medicine

## 2020-03-12 ENCOUNTER — Encounter: Payer: Self-pay | Admitting: Family Medicine

## 2020-03-12 ENCOUNTER — Other Ambulatory Visit: Payer: Self-pay

## 2020-03-12 VITALS — BP 136/82 | HR 83 | Temp 98.1°F | Resp 16 | Wt 178.3 lb

## 2020-03-12 DIAGNOSIS — Z23 Encounter for immunization: Secondary | ICD-10-CM

## 2020-03-12 DIAGNOSIS — Z0000 Encounter for general adult medical examination without abnormal findings: Secondary | ICD-10-CM | POA: Diagnosis not present

## 2020-03-12 DIAGNOSIS — Z87891 Personal history of nicotine dependence: Secondary | ICD-10-CM | POA: Diagnosis not present

## 2020-03-12 DIAGNOSIS — Z780 Asymptomatic menopausal state: Secondary | ICD-10-CM | POA: Diagnosis not present

## 2020-03-12 DIAGNOSIS — Z1231 Encounter for screening mammogram for malignant neoplasm of breast: Secondary | ICD-10-CM

## 2020-03-12 NOTE — Progress Notes (Signed)
I,April Miller,acting as a scribe for Shirlee LatchAngela Rosemae Mcquown, MD.,have documented all relevant documentation on the behalf of Shirlee LatchAngela Niani Mourer, MD,as directed by  Shirlee LatchAngela Rashanda Magloire, MD while in the presence of Shirlee LatchAngela Kaidyn Javid, MD.   Medicare Initial Preventative Physical Exam    Patient: Kim Ritter, Female    DOB: [birth year], 65 y.o.   MRN: [id number] Visit Date: 03/12/2020  Today's Provider: Shirlee LatchAngela Celines Femia, MD   Subjective:    Chief Complaint  Patient presents with   Medicare Wellness    Medicare Initial Preventative Physical Exam Kim Morgannnie D Cruise is a 65 y.o. female who presents today for her Initial Preventative Physical Exam.   HPI  Social History   Socioeconomic History   Marital status: Married    Spouse name: Casimiro NeedleMichael   Number of children: 2   Years of education: HS   Highest education level: Not on file  Occupational History    Employer: SELF EMPLOYED  Tobacco Use   Smoking status: Former Smoker    Packs/day: 0.50    Years: 25.00    Pack years: 12.50    Types: Cigarettes    Quit date: 01/2018    Years since quitting: 2.1   Smokeless tobacco: Never Used  Vaping Use   Vaping Use: Never used  Substance and Sexual Activity   Alcohol use: No   Drug use: No   Sexual activity: Yes    Birth control/protection: Post-menopausal  Other Topics Concern   Not on file  Social History Narrative   Not on file   Social Determinants of Health   Financial Resource Strain: Not on file  Food Insecurity: Not on file  Transportation Needs: Not on file  Physical Activity: Not on file  Stress: Not on file  Social Connections: Not on file  Intimate Partner Violence: Not on file    History reviewed. No pertinent past medical history.   Patient Active Problem List   Diagnosis Date Noted   Prediabetes 10/01/2017   History of tobacco use disorder 09/29/2016   Healthcare maintenance 09/15/2016   Obesity 09/15/2016   HTN (hypertension)  09/15/2016   Hyperlipidemia 05/27/2014   Elevated hemoglobin (HCC) 04/29/2014   Other seasonal allergic rhinitis 04/29/2014    Past Surgical History:  Procedure Laterality Date   BREAST REDUCTION SURGERY Bilateral 1998   COLONOSCOPY WITH PROPOFOL N/A 07/13/2014   Procedure: COLONOSCOPY WITH PROPOFOL;  Surgeon: Elnita MaxwellMatthew Gordon Rein, MD;  [facility];  Service: Endoscopy;  Laterality: N/A;   HEMORROIDECTOMY     REDUCTION MAMMAPLASTY      Her family history includes Congestive Heart Failure in her mother; Kidney failure in her mother; Melanoma in her mother. There is no history of Breast cancer.   Current Outpatient Medications:    amLODipine (NORVASC) 10 MG tablet, TAKE 1 TABLET (10 MG TOTAL) BY MOUTH DAILY. PLEASE MAKE AN OFFICE VISIT BEFORE ANYMORE REFILLS., Disp: 13 tablet, Rfl: 0   ascorbic acid (VITAMIN C) 500 MG tablet, , Disp: , Rfl:    atorvastatin (LIPITOR) 40 MG tablet, TAKE 1 TABLET BY MOUTH EVERY DAY, Disp: 90 tablet, Rfl: 1   Biotin 10 MG TABS, , Disp: , Rfl:    Cholecalciferol (VITAMIN D3) 50 MCG (2000 UT) TABS, , Disp: , Rfl:    hydrochlorothiazide (HYDRODIURIL) 12.5 MG tablet, TAKE 1 TABLET BY MOUTH DAILY. PLEASE SCHEDULE OFFICE VISIT BEFORE ANY FUTURE REFILLS., Disp: 90 tablet, Rfl: 1   Multiple Vitamin (MULTIVITAMIN WITH MINERALS) TABS tablet, Take 1 tablet by mouth daily.,  Disp: , Rfl:    ranitidine (ZANTAC) 150 MG tablet, , Disp: , Rfl:    Zinc Sulfate 220 (50 Zn) MG TABS, , Disp: , Rfl:    Patient Care Team: Virginia Crews, MD as PCP - General (Family Medicine)  Review of Systems  Constitutional: Negative.   HENT: Negative.   Eyes: Negative.   Respiratory: Negative.   Cardiovascular: Negative.   Gastrointestinal: Negative.   Endocrine: Negative.   Genitourinary: Negative.   Musculoskeletal: Negative.   Skin: Negative.   Allergic/Immunologic: Negative.   Neurological: Negative.   Hematological: Negative.    Psychiatric/Behavioral: Negative.        Objective:    Vitals: BP 136/82 (BP Location: Left Arm, Patient Position: Sitting, Cuff Size: Large)    Pulse 83    Temp 98.1 F (36.7 C) (Oral)    Resp 16    Wt 178 lb 4.8 oz (80.9 kg)    SpO2 97%    BMI 32.61 kg/m   Hearing Screening   125Hz  250Hz  500Hz  1000Hz  2000Hz  3000Hz  4000Hz  6000Hz  8000Hz   Right ear:   40 25 25  25     Left ear:   25 25 25  25       Visual Acuity Screening   Right eye Left eye Both eyes  Without correction:     With correction: 20/20 20/30 20/30    Physical Exam Vitals reviewed.  Constitutional:      General: She is not in acute distress.    Appearance: Normal appearance. She is well-developed. She is not diaphoretic.  HENT:     Head: Normocephalic and atraumatic.     Right Ear: Tympanic membrane, ear canal and external ear normal.     Left Ear: Tympanic membrane, ear canal and external ear normal.  Eyes:     General: No scleral icterus.    Conjunctiva/sclera: Conjunctivae normal.     Pupils: Pupils are equal, round, and reactive to light.  Neck:     Thyroid: No thyromegaly.  Cardiovascular:     Rate and Rhythm: Normal rate and regular rhythm.     Pulses: Normal pulses.     Heart sounds: Normal heart sounds. No murmur heard.   Pulmonary:     Effort: Pulmonary effort is normal. No respiratory distress.     Breath sounds: Normal breath sounds. No wheezing or rales.  Chest:     Comments: Breasts: breasts appear normal, no suspicious masses, no skin or nipple changes or axillary nodes. Scarring below breasts from previous breast reduction surgery Abdominal:     General: There is no distension.     Palpations: Abdomen is soft.     Tenderness: There is no abdominal tenderness.  Musculoskeletal:        General: No deformity.     Cervical back: Neck supple.     Right lower leg: No edema.     Left lower leg: No edema.  Lymphadenopathy:     Cervical: No cervical adenopathy.  Skin:    General: Skin is warm  and dry.     Findings: No rash.  Neurological:     Mental Status: She is alert and oriented to person, place, and time. Mental status is at baseline.     Sensory: No sensory deficit.     Motor: No weakness.     Gait: Gait normal.  Psychiatric:        Mood and Affect: Mood normal.        Behavior: Behavior normal.  Thought Content: Thought content normal.      EKG: wnl, NSR, no signs of ischemia. Activities of Daily Living In your present state of health, do you have any difficulty performing the following activities: 03/12/2020  Hearing? N  Vision? N  Difficulty concentrating or making decisions? N  Walking or climbing stairs? N  Dressing or bathing? N  Doing errands, shopping? N  Some recent data might be hidden    Fall Risk Assessment Fall Risk  03/12/2020 07/18/2018 09/15/2016  Falls in the past year? 0 0 No  Number falls in past yr: 0 - -  Injury with Fall? 0 - -  Risk for fall due to : No Fall Risks - -  Follow up Falls evaluation completed - -     Depression Screen PHQ 2/9 Scores 03/12/2020 04/22/2019 10/01/2017 09/15/2016  PHQ - 2 Score 0 0 0 2  PHQ- 9 Score 1 - 0 -    No flowsheet data found.    Assessment & Plan:    Initial Preventative Physical Exam  Reviewed patient's Family Medical History Reviewed and updated list of patient's medical providers Assessment of cognitive impairment was done Assessed patient's functional ability Established a written schedule for health screening [place] Completed and Reviewed  Exercise Activities and Dietary recommendations Goals   None     Immunization History  Administered Date(s) Administered   Fluad Quad(high Dose 65+) 03/12/2020   Influenza,inj,Quad PF,6+ Mos 10/01/2017, 11/19/2018   Influenza-Unspecified 09/23/2016   Janssen (J&J) SARS-COV-2 Vaccination 07/02/2019   Pneumococcal Polysaccharide-23 03/12/2020   Tdap 04/22/2019    Health Maintenance  Topic Date Due    MAMMOGRAM  10/07/2018   COVID-19 Vaccine (2 - Booster for [employer] series) 08/27/2019   DEXA SCAN  Never done   PNA vac Low Risk Adult (2 of 2 - PCV13) 03/12/2021   PAP SMEAR-Modifier  09/15/2021   COLONOSCOPY (Pts 45-22yrs Insurance coverage will need to be confirmed)  07/12/2024   TETANUS/TDAP  04/21/2029   INFLUENZA VACCINE  Completed   Hepatitis C Screening  Completed   HIV Screening  Completed     Discussed health benefits of physical activity, and encouraged her to engage in regular exercise appropriate for her age and condition.   Problem List Items Addressed This Visit      Other   History of tobacco use disorder   Relevant Orders   Ambulatory Referral for Lung Cancer Scre    Other Visit Diagnoses    Welcome to Medicare preventive visit    -  Primary   Relevant Orders   EKG 12-Lead (Completed)   Need for 23-polyvalent pneumococcal polysaccharide vaccine       Relevant Orders   Pneumococcal polysaccharide vaccine 23-valent greater than or equal to 2yo subcutaneous/IM (Completed)   Breast cancer screening by mammogram       Relevant Orders   MM 3D SCREEN BREAST BILATERAL   Postmenopausal       Relevant Orders   DG Bone Density   Need for influenza vaccination       Relevant Orders   Flu Vaccine QUAD High Dose(Fluad) (Completed)          Return in about 3 months (around 06/09/2020) for chronic disease f/u.   I, Lavon Paganini, MD, have reviewed all documentation for this visit. The documentation on 03/12/20 for the exam, diagnosis, procedures, and orders are all accurate and complete.   Marlean Mortell, Dionne Bucy, MD, MPH [facility]

## 2020-03-12 NOTE — Patient Instructions (Addendum)
The CDC recommends two doses of Shingrix (the shingles vaccine) separated by 2 to 6 months for adults age 66 years and older. I recommend checking with your insurance plan regarding coverage for this vaccine.    Also consider COVID booster  Preventive Care 65 Years and Older, Female Preventive care refers to lifestyle choices and visits with your health care provider that can promote health and wellness. This includes:  A yearly physical exam. This is also called an annual wellness visit.  Regular dental and eye exams.  Immunizations.  Screening for certain conditions.  Healthy lifestyle choices, such as: ? Eating a healthy diet. ? Getting regular exercise. ? Not using drugs or products that contain nicotine and tobacco. ? Limiting alcohol use. What can I expect for my preventive care visit? Physical exam Your health care provider will check your:  Height and weight. These may be used to calculate your BMI (body mass index). BMI is a measurement that tells if you are at a healthy weight.  Heart rate and blood pressure.  Body temperature.  Skin for abnormal spots. Counseling Your health care provider may ask you questions about your:  Past medical problems.  Family's medical history.  Alcohol, tobacco, and drug use.  Emotional well-being.  Home life and relationship well-being.  Sexual activity.  Diet, exercise, and sleep habits.  History of falls.  Memory and ability to understand (cognition).  Work and work Statistician.  Pregnancy and menstrual history.  Access to firearms. What immunizations do I need? Vaccines are usually given at various ages, according to a schedule. Your health care provider will recommend vaccines for you based on your age, medical history, and lifestyle or other factors, such as travel or where you work.   What tests do I need? Blood tests  Lipid and cholesterol levels. These may be checked every 5 years, or more often depending  on your overall health.  Hepatitis C test.  Hepatitis B test. Screening  Lung cancer screening. You may have this screening every year starting at age 15 if you have a 30-pack-year history of smoking and currently smoke or have quit within the past 15 years.  Colorectal cancer screening. ? All adults should have this screening starting at age 33 and continuing until age 27. ? Your health care provider may recommend screening at age 38 if you are at increased risk. ? You will have tests every 1-10 years, depending on your results and the type of screening test.  Diabetes screening. ? This is done by checking your blood sugar (glucose) after you have not eaten for a while (fasting). ? You may have this done every 1-3 years.  Mammogram. ? This may be done every 1-2 years. ? Talk with your health care provider about how often you should have regular mammograms.  Abdominal aortic aneurysm (AAA) screening. You may need this if you are a current or former smoker.  BRCA-related cancer screening. This may be done if you have a family history of breast, ovarian, tubal, or peritoneal cancers. Other tests  STD (sexually transmitted disease) testing, if you are at risk.  Bone density scan. This is done to screen for osteoporosis. You may have this done starting at age 5. Talk with your health care provider about your test results, treatment options, and if necessary, the need for more tests. Follow these instructions at home: Eating and drinking  Eat a diet that includes fresh fruits and vegetables, whole grains, lean protein, and low-fat dairy  products. Limit your intake of foods with high amounts of sugar, saturated fats, and salt.  Take vitamin and mineral supplements as recommended by your health care provider.  Do not drink alcohol if your health care provider tells you not to drink.  If you drink alcohol: ? Limit how much you have to 0-1 drink a day. ? Be aware of how much alcohol  is in your drink. In the [place] one drink equals one 12 oz bottle of beer (355 mL), one 5 oz glass of wine (148 mL), or one 1 oz glass of hard liquor (44 mL).   Lifestyle  Take daily care of your teeth and gums. Brush your teeth every morning and night with fluoride toothpaste. Floss one time each day.  Stay active. Exercise for at least 30 minutes 5 or more days each week.  Do not use any products that contain nicotine or tobacco, such as cigarettes, e-cigarettes, and chewing tobacco. If you need help quitting, ask your health care provider.  Do not use drugs.  If you are sexually active, practice safe sex. Use a condom or other form of protection in order to prevent STIs (sexually transmitted infections).  Talk with your health care provider about taking a low-dose aspirin or statin.  Find healthy ways to cope with stress, such as: ? Meditation, yoga, or listening to music. ? Journaling. ? Talking to a trusted person. ? Spending time with friends and family. Safety  Always wear your seat belt while driving or riding in a vehicle.  Do not drive: ? If you have been drinking alcohol. Do not ride with someone who has been drinking. ? When you are tired or distracted. ? While texting.  Wear a helmet and other protective equipment during sports activities.  If you have firearms in your house, make sure you follow all gun safety procedures. What's next?  Visit your health care provider once a year for an annual wellness visit.  Ask your health care provider how often you should have your eyes and teeth checked.  Stay up to date on all vaccines. This information is not intended to replace advice given to you by your health care provider. Make sure you discuss any questions you have with your health care provider. Document Revised: 01/14/2020 Document Reviewed: 01/17/2018 Elsevier Patient Education  2021 [employer].

## 2020-03-25 ENCOUNTER — Telehealth: Payer: Self-pay | Admitting: *Deleted

## 2020-03-25 NOTE — Telephone Encounter (Signed)
Received referral for low dose lung cancer screening CT scan. Message left at phone number listed in EMR for patient to call me back to facilitate scheduling scan.  

## 2020-03-26 ENCOUNTER — Encounter: Payer: Self-pay | Admitting: *Deleted

## 2020-03-26 ENCOUNTER — Telehealth: Payer: Self-pay | Admitting: *Deleted

## 2020-03-26 DIAGNOSIS — Z122 Encounter for screening for malignant neoplasm of respiratory organs: Secondary | ICD-10-CM

## 2020-03-26 DIAGNOSIS — Z87891 Personal history of nicotine dependence: Secondary | ICD-10-CM

## 2020-03-26 NOTE — Telephone Encounter (Signed)
Received referral for initial lung cancer screening scan. Contacted patient and obtained smoking history,(former, quit 01/06/18, 66 pack year) as well as answering questions related to screening process. Patient denies signs of lung cancer such as weight loss or hemoptysis. Patient denies comorbidity that would prevent curative treatment if lung cancer were found. Patient is scheduled for shared decision making visit and CT scan on 04/07/20 at 1015am.

## 2020-04-07 ENCOUNTER — Other Ambulatory Visit: Payer: Self-pay

## 2020-04-07 ENCOUNTER — Ambulatory Visit
Admission: RE | Admit: 2020-04-07 | Discharge: 2020-04-07 | Disposition: A | Discharge status: Home / Self Care | Payer: PPO | Source: Ambulatory Visit | Attending: Oncology | Admitting: Oncology

## 2020-04-07 ENCOUNTER — Inpatient Hospital Stay: Payer: PPO | Attending: Oncology | Admitting: Oncology

## 2020-04-07 DIAGNOSIS — Z122 Encounter for screening for malignant neoplasm of respiratory organs: Secondary | ICD-10-CM | POA: Diagnosis not present

## 2020-04-07 DIAGNOSIS — Z87891 Personal history of nicotine dependence: Secondary | ICD-10-CM

## 2020-04-07 NOTE — Progress Notes (Signed)
Virtual Visit via Video Note  I connected with Kim Ritter on 04/07/20 at 10:15 AM EST by a video enabled telemedicine application and verified that I am speaking with the correct person using two identifiers.  Location: Patient: [facility] Provider: Clinic    I discussed the limitations of evaluation and management by telemedicine and the availability of in person appointments. The patient expressed understanding and agreed to proceed.  I discussed the assessment and treatment plan with the patient. The patient was provided an opportunity to ask questions and all were answered. The patient agreed with the plan and demonstrated an understanding of the instructions.   The patient was advised to call back or seek an in-person evaluation if the symptoms worsen or if the condition fails to improve as anticipated.   In accordance with CMS guidelines, patient has met eligibility criteria including age, absence of signs or symptoms of lung cancer.  Social History   Tobacco Use  . Smoking status: Former Smoker    Packs/day: 1.50    Years: 44.00    Pack years: 66.00    Types: Cigarettes    Quit date: 01/2018    Years since quitting: 2.2  . Smokeless tobacco: Never Used  Vaping Use  . Vaping Use: Never used  Substance Use Topics  . Alcohol use: No  . Drug use: No      A shared decision-making session was conducted prior to the performance of CT scan. This includes one or more decision aids, includes benefits and harms of screening, follow-up diagnostic testing, over-diagnosis, false positive rate, and total radiation exposure.   Counseling on the importance of adherence to annual lung cancer LDCT screening, impact of co-morbidities, and ability or willingness to undergo diagnosis and treatment is imperative for compliance of the program.   Counseling on the importance of continued smoking cessation for former smokers; the importance of smoking cessation for current smokers, and information  about tobacco cessation interventions have been given to patient including [place] and [facility] quit [place] programs.   Written order for lung cancer screening with LDCT has been given to the patient and any and all questions have been answered to the best of my abilities.    Yearly follow up will be coordinated by Burgess Estelle, Thoracic Navigator.  I provided 15 minutes of face-to-face video visit time during this encounter, and > 50% was spent counseling as documented under my assessment & plan.   Jacquelin Hawking, NP

## 2020-04-15 ENCOUNTER — Encounter: Payer: Self-pay | Admitting: *Deleted

## 2020-04-15 ENCOUNTER — Other Ambulatory Visit: Payer: Self-pay | Admitting: Family Medicine

## 2020-06-14 ENCOUNTER — Ambulatory Visit (INDEPENDENT_AMBULATORY_CARE_PROVIDER_SITE_OTHER): Payer: PPO | Admitting: Family Medicine

## 2020-06-14 ENCOUNTER — Encounter: Payer: Self-pay | Admitting: Family Medicine

## 2020-06-14 ENCOUNTER — Other Ambulatory Visit: Payer: Self-pay

## 2020-06-14 VITALS — BP 128/79 | HR 86 | Temp 98.2°F | Resp 16 | Ht 62.5 in | Wt 179.9 lb

## 2020-06-14 DIAGNOSIS — I10 Essential (primary) hypertension: Secondary | ICD-10-CM

## 2020-06-14 DIAGNOSIS — J439 Emphysema, unspecified: Secondary | ICD-10-CM | POA: Diagnosis not present

## 2020-06-14 DIAGNOSIS — E66811 Obesity, class 1: Secondary | ICD-10-CM

## 2020-06-14 DIAGNOSIS — D582 Other hemoglobinopathies: Secondary | ICD-10-CM | POA: Diagnosis not present

## 2020-06-14 DIAGNOSIS — I700 Atherosclerosis of aorta: Secondary | ICD-10-CM

## 2020-06-14 DIAGNOSIS — R7303 Prediabetes: Secondary | ICD-10-CM | POA: Diagnosis not present

## 2020-06-14 DIAGNOSIS — Z87891 Personal history of nicotine dependence: Secondary | ICD-10-CM

## 2020-06-14 DIAGNOSIS — E782 Mixed hyperlipidemia: Secondary | ICD-10-CM

## 2020-06-14 DIAGNOSIS — Z6832 Body mass index (BMI) 32.0-32.9, adult: Secondary | ICD-10-CM

## 2020-06-14 DIAGNOSIS — E669 Obesity, unspecified: Secondary | ICD-10-CM | POA: Diagnosis not present

## 2020-06-14 LAB — POCT GLYCOSYLATED HEMOGLOBIN (HGB A1C)
Est. average glucose Bld gHb Est-mCnc: 111
Hemoglobin A1C: 5.5 % (ref 4.0–5.6)

## 2020-06-14 MED ORDER — ATORVASTATIN CALCIUM 40 MG PO TABS: 1.0000 | ORAL_TABLET | Freq: Every day | ORAL | 1 refills | Status: DC

## 2020-06-14 MED ORDER — AMLODIPINE BESYLATE 10 MG PO TABS: 10.0000 mg | ORAL_TABLET | Freq: Every day | ORAL | 0 refills | Status: DC

## 2020-06-14 MED ORDER — HYDROCHLOROTHIAZIDE 12.5 MG PO TABS: ORAL_TABLET | ORAL | 1 refills | Status: DC

## 2020-06-14 NOTE — Assessment & Plan Note (Signed)
Continue risk factor management. 

## 2020-06-14 NOTE — Progress Notes (Signed)
Established patient visit   Patient: Kim Ritter   DOB: [birth year]   65 y.o. Female  MRN: [id number] Visit Date: 06/14/2020  Today's healthcare provider: Lavon Paganini, MD   Chief Complaint  Patient presents with  . Hypertension  . Hyperlipidemia  . Hyperglycemia   Subjective    Hypertension Pertinent negatives include no chest pain, headaches, palpitations or shortness of breath.  Hyperlipidemia Pertinent negatives include no chest pain or shortness of breath.  Hyperglycemia Pertinent negatives include no abdominal pain, chest pain, chills, congestion, coughing, fatigue, fever, headaches, nausea, sore throat or vomiting.    Hypertension, follow-up  BP Readings from Last 3 Encounters:  06/14/20 128/79  03/12/20 136/82  04/22/19 121/74   Wt Readings from Last 3 Encounters:  06/14/20 179 lb 14.4 oz (81.6 kg)  04/07/20 172 lb (78 kg)  03/12/20 178 lb 4.8 oz (80.9 kg)     She was last seen for hypertension 3 months ago.  BP at that visit was 136/82. Management since that visit includes no changes.  She reports excellent compliance with treatment. She is not having side effects.  She is following a Regular diet. She is exercising. She does not smoke.  Use of agents associated with hypertension: none.   Outside blood pressures are stable. Symptoms: No chest pain No chest pressure  No palpitations No syncope  No dyspnea No orthopnea  No paroxysmal nocturnal dyspnea No lower extremity edema   Pertinent labs: Lab Results  Component Value Date   CHOL 147 04/22/2019   HDL 34 (L) 04/22/2019   LDLCALC 76 04/22/2019   TRIG 225 (H) 04/22/2019   CHOLHDL 4.3 04/22/2019   Lab Results  Component Value Date   NA 141 04/22/2019   K 4.0 04/22/2019   CREATININE 0.71 04/22/2019   GFRNONAA 90 04/22/2019   GFRAA 104 04/22/2019   GLUCOSE 91 04/22/2019     The 10-year ASCVD risk score Mikey Bussing DC Jr., et al., 2013) is: 14%    --------------------------------------------------------------------------------------------------- Lipid/Cholesterol, Follow-up  Last lipid panel Other pertinent labs  Lab Results  Component Value Date   CHOL 147 04/22/2019   HDL 34 (L) 04/22/2019   LDLCALC 76 04/22/2019   TRIG 225 (H) 04/22/2019   CHOLHDL 4.3 04/22/2019   Lab Results  Component Value Date   ALT 28 04/22/2019   AST 25 04/22/2019   PLT 232 04/22/2019     She was last seen for this 1 years ago.  Management since that visit includes no changes.  She reports excellent compliance with treatment. She is not having side effects.   Symptoms: No chest pain No chest pressure/discomfort  No dyspnea No lower extremity edema  No numbness or tingling of extremity No orthopnea  No palpitations No paroxysmal nocturnal dyspnea  No speech difficulty No syncope   Current diet: in general, an "unhealthy" diet Current exercise: gardening, housecleaning and no regular exercise  The 10-year ASCVD risk score Mikey Bussing DC Jr., et al., 2013) is: 14%  --------------------------------------------------------------------------------------------------- Prediabetes, Follow-up  Lab Results  Component Value Date   HGBA1C 5.5 06/14/2020   HGBA1C 6.0 (H) 04/22/2019   HGBA1C 5.7 (H) 04/15/2018   GLUCOSE 91 04/22/2019   GLUCOSE 71 04/15/2018   GLUCOSE 105 (H) 10/02/2017    Last seen for for this 1 years ago.  Management since that visit includes no changes. Current symptoms include none and have been stable.  Prior visit with dietician: no Current diet: in general, an "unhealthy" diet Current exercise:  gardening, housecleaning and no regular exercise  Pertinent Labs:    Component Value Date/Time   CHOL 147 04/22/2019 0929   TRIG 225 (H) 04/22/2019 0929   CHOLHDL 4.3 04/22/2019 0929   CREATININE 0.71 04/22/2019 0929    Wt Readings from Last 3 Encounters:  06/14/20 179 lb 14.4 oz (81.6 kg)  04/07/20 172 lb (78 kg)   03/12/20 178 lb 4.8 oz (80.9 kg)    -----------------------------------------------------------------------------------------   Patient Active Problem List   Diagnosis Date Noted  . Aortic atherosclerosis ([place]) 06/14/2020  . Pulmonary emphysema ([place]) 06/14/2020  . Prediabetes 10/01/2017  . History of tobacco use disorder 09/29/2016  . Healthcare maintenance 09/15/2016  . Obesity 09/15/2016  . Essential hypertension 09/15/2016  . Hyperlipidemia 05/27/2014  . Elevated hemoglobin ([place]) 04/29/2014  . Other seasonal allergic rhinitis 04/29/2014   Social History   Tobacco Use  . Smoking status: Former Smoker    Packs/day: 1.50    Years: 44.00    Pack years: 66.00    Types: Cigarettes    Quit date: 01/2018    Years since quitting: 2.4  . Smokeless tobacco: Never Used  Vaping Use  . Vaping Use: Never used  Substance Use Topics  . Alcohol use: No  . Drug use: No   No Known Allergies     Medications: Outpatient Medications Prior to Visit  Medication Sig  . ascorbic acid (VITAMIN C) 500 MG tablet   . Biotin 10 MG TABS   . Cholecalciferol (VITAMIN D3) 50 MCG (2000 UT) TABS   . Multiple Vitamin (MULTIVITAMIN WITH MINERALS) TABS tablet Take 1 tablet by mouth daily.  . ranitidine (ZANTAC) 150 MG tablet   . Zinc Sulfate 220 (50 Zn) MG TABS   . [DISCONTINUED] amLODipine (NORVASC) 10 MG tablet TAKE 1 TABLET (10 MG TOTAL) BY MOUTH DAILY. PLEASE MAKE AN OFFICE VISIT BEFORE ANYMORE REFILLS.  . [DISCONTINUED] atorvastatin (LIPITOR) 40 MG tablet TAKE 1 TABLET BY MOUTH EVERY DAY  . [DISCONTINUED] hydrochlorothiazide (HYDRODIURIL) 12.5 MG tablet TAKE 1 TABLET BY MOUTH DAILY. PLEASE SCHEDULE OFFICE VISIT BEFORE ANY FUTURE REFILLS.   No facility-administered medications prior to visit.    Review of Systems  Constitutional: Negative for activity change, chills, fatigue and fever.  HENT: Negative for congestion, ear pain, rhinorrhea, sinus pain and sore throat.   Respiratory: Negative  for cough, chest tightness, shortness of breath and wheezing.   Cardiovascular: Negative for chest pain, palpitations and leg swelling.  Gastrointestinal: Negative for abdominal pain, blood in stool, diarrhea, nausea and vomiting.  Genitourinary: Negative for dysuria, flank pain, frequency and urgency.  Neurological: Negative for dizziness and headaches.        Objective    BP 128/79 Comment: home reading  Pulse 86   Temp 98.2 F (36.8 C) (Oral)   Resp 16   Ht 5' 2.5" (1.588 m)   Wt 179 lb 14.4 oz (81.6 kg)   SpO2 100%   BMI 32.38 kg/m  BP Readings from Last 3 Encounters:  06/14/20 128/79  03/12/20 136/82  04/22/19 121/74   Wt Readings from Last 3 Encounters:  06/14/20 179 lb 14.4 oz (81.6 kg)  04/07/20 172 lb (78 kg)  03/12/20 178 lb 4.8 oz (80.9 kg)      Physical Exam Vitals reviewed.  Constitutional:      General: She is not in acute distress.    Appearance: Normal appearance. She is well-developed. She is not diaphoretic.  HENT:     Head: Normocephalic and atraumatic.  Eyes:     General: No scleral icterus.    Conjunctiva/sclera: Conjunctivae normal.  Neck:     Thyroid: No thyromegaly.  Cardiovascular:     Rate and Rhythm: Normal rate and regular rhythm.     Pulses: Normal pulses.     Heart sounds: Normal heart sounds. No murmur heard.   Pulmonary:     Effort: Pulmonary effort is normal. No respiratory distress.     Breath sounds: Normal breath sounds. No wheezing, rhonchi or rales.  Musculoskeletal:     Cervical back: Neck supple.     Right lower leg: No edema.     Left lower leg: No edema.  Lymphadenopathy:     Cervical: No cervical adenopathy.  Skin:    General: Skin is warm and dry.     Findings: No rash.  Neurological:     Mental Status: She is alert and oriented to person, place, and time. Mental status is at baseline.  Psychiatric:        Mood and Affect: Mood normal.        Behavior: Behavior normal.      Results for orders placed or  performed in visit on 06/14/20  POCT glycosylated hemoglobin (Hb A1C)  Result Value Ref Range   Hemoglobin A1C 5.5 4.0 - 5.6 %   Est. average glucose Bld gHb Est-mCnc 111     Assessment & Plan     Problem List Items Addressed This Visit      Cardiovascular and Mediastinum   Essential hypertension - Primary    Well controlled Continue current medications Recheck metabolic panel F/u in 6 months       Relevant Medications   amLODipine (NORVASC) 10 MG tablet   hydrochlorothiazide (HYDRODIURIL) 12.5 MG tablet   atorvastatin (LIPITOR) 40 MG tablet   Other Relevant Orders   Comprehensive metabolic panel   Aortic atherosclerosis (HCC)    Continue risk factor management      Relevant Medications   amLODipine (NORVASC) 10 MG tablet   hydrochlorothiazide (HYDRODIURIL) 12.5 MG tablet   atorvastatin (LIPITOR) 40 MG tablet     Respiratory   Pulmonary emphysema (HCC)    Continue to abstain from smoking        Other   Obesity    Discussed importance of healthy weight management Discussed diet and exercise       History of tobacco use disorder    Continues to abstain from smoking      Hyperlipidemia    Previously well controlled Continue statin Repeat FLP and CMP      Relevant Medications   amLODipine (NORVASC) 10 MG tablet   hydrochlorothiazide (HYDRODIURIL) 12.5 MG tablet   atorvastatin (LIPITOR) 40 MG tablet   Other Relevant Orders   Lipid panel   Comprehensive metabolic panel   Elevated hemoglobin (HCC)   Relevant Orders   CBC w/Diff/Platelet   Prediabetes    Well controlled Continue low carb diet      Relevant Orders   POCT glycosylated hemoglobin (Hb A1C) (Completed)       Return in about 6 months (around 12/15/2020) for chronic disease f/u.      Frederic Jericho [place],acting as a Education administrator for Lavon Paganini, MD.,have documented all relevant documentation on the behalf of Lavon Paganini, MD,as directed by  Lavon Paganini, MD while in the  presence of Lavon Paganini, MD.   I, Lavon Paganini, MD, have reviewed all documentation for this visit. The documentation on 06/14/20 for the exam, diagnosis, procedures, and  orders are all accurate and complete.   Bela Bonaparte, Dionne Bucy, MD, MPH [facility]

## 2020-06-14 NOTE — Assessment & Plan Note (Signed)
Previously well controlled Continue statin Repeat FLP and CMP  

## 2020-06-14 NOTE — Patient Instructions (Signed)
Diabetes Care, 44(Suppl 1), D40-C14. https://doi.org/https://doi.org/10.2337/dc21-[email or web address]">  Prediabetes Prediabetes is when your blood sugar (blood glucose) level is higher than normal but not high enough for you to be diagnosed with type 2 diabetes. Having prediabetes puts you at risk for developing type 2 diabetes (type 2 diabetes mellitus). With certain lifestyle changes, you may be able to prevent or delay the onset of type 2 diabetes. This is important because type 2 diabetes can lead to serious complications, such as:  Heart disease.  Stroke.  Blindness.  Kidney disease.  Depression.  Poor circulation in the feet and legs. In severe cases, this could lead to surgical removal of a leg (amputation). What are the causes? The exact cause of prediabetes is not known. It may result from insulin resistance. Insulin resistance develops when cells in the body do not respond properly to insulin that the body makes. This can cause excess glucose to build up in the blood. High blood glucose (hyperglycemia) can develop. What increases the risk? The following factors may make you more likely to develop this condition:  You have a family member with type 2 diabetes.  You are older than 45 years.  You had a temporary form of diabetes during a pregnancy (gestational diabetes).  You had polycystic ovary syndrome (PCOS).  You are overweight or obese.  You are inactive (sedentary).  You have a history of heart disease, including problems with cholesterol levels, high levels of blood fats, or high blood pressure. What are the signs or symptoms? You may have no symptoms. If you do have symptoms, they may include:  Increased hunger.  Increased thirst.  Increased urination.  Vision changes, such as blurry vision.  Tiredness (fatigue). How is this diagnosed? This condition can be diagnosed with blood tests. Your blood glucose may be checked with one or more of the following tests:  A  fasting blood glucose (FBG) test. You will not be allowed to eat (you will fast) for at least 8 hours before a blood sample is taken.  An A1C blood test (hemoglobin A1C). This test provides information about blood glucose levels over the previous 2?3 months.  An oral glucose tolerance test (OGTT). This test measures your blood glucose at two points in time: ? After fasting. This is your baseline level. ? Two hours after you drink a beverage that contains glucose. You may be diagnosed with prediabetes if:  Your FBG is 100?125 mg/dL (5.6-6.9 mmol/L).  Your A1C level is 5.7?6.4% (39-46 mmol/mol).  Your OGTT result is 140?199 mg/dL (7.8-11 mmol/L). These blood tests may be repeated to confirm your diagnosis.   How is this treated? Treatment may include dietary and lifestyle changes to help lower your blood glucose and prevent type 2 diabetes from developing. In some cases, medicine may be prescribed to help lower the risk of type 2 diabetes. Follow these instructions at home: Nutrition  Follow a healthy meal plan. This includes eating lean proteins, whole grains, legumes, fresh fruits and vegetables, low-fat dairy products, and healthy fats.  Follow instructions from your health care provider about eating or drinking restrictions.  Meet with a dietitian to create a healthy eating plan that is right for you.   Lifestyle  Do moderate-intensity exercise for at least 30 minutes a day on 5 or more days each week, or as told by your health care provider. A mix of activities may be best, such as: ? Brisk walking, swimming, biking, and weight lifting.  Lose weight as told by your health  care provider. Losing 5-7% of your body weight can reverse insulin resistance.  Do not drink alcohol if: ? Your health care provider tells you not to drink. ? You are pregnant, may be pregnant, or are planning to become pregnant.  If you drink alcohol: ? Limit how much you use to:  0-1 drink a day for  women.  0-2 drinks a day for men. ? Be aware of how much alcohol is in your drink. In the [place] one drink equals one 12 oz bottle of beer (355 mL), one 5 oz glass of wine (148 mL), or one 1 oz glass of hard liquor (44 mL). General instructions  Take over-the-counter and prescription medicines only as told by your health care provider. You may be prescribed medicines that help lower the risk of type 2 diabetes.  Do not use any products that contain nicotine or tobacco, such as cigarettes, e-cigarettes, and chewing tobacco. If you need help quitting, ask your health care provider.  Keep all follow-up visits. This is important. Where to find more information  American Diabetes Association: www.diabetes.org  Academy of Nutrition and Dietetics: www.eatright.org  American Heart Association: www.heart.org Contact a health care provider if:  You have any of these symptoms: ? Increased hunger. ? Increased urination. ? Increased thirst. ? Fatigue. ? Vision changes, such as blurry vision. Get help right away if you:  Have shortness of breath.  Feel confused.  Vomit or feel like you may vomit. Summary  Prediabetes is when your blood sugar (blood glucose)level is higher than normal but not high enough for you to be diagnosed with type 2 diabetes.  Having prediabetes puts you at risk for developing type 2 diabetes (type 2 diabetes mellitus).  Make lifestyle changes such as eating a healthy diet and exercising regularly to help prevent diabetes. Lose weight as told by your health care provider. This information is not intended to replace advice given to you by your health care provider. Make sure you discuss any questions you have with your health care provider. Document Revised: 04/24/2019 Document Reviewed: 04/24/2019 Elsevier Patient Education  2021 Elsevier Inc. Hypertension, Adult High blood pressure (hypertension) is when the force of blood pumping through the arteries is too  strong. The arteries are the blood vessels that carry blood from the heart throughout the body. Hypertension forces the heart to work harder to pump blood and may cause arteries to become narrow or stiff. Untreated or uncontrolled hypertension can cause a heart attack, heart failure, a stroke, kidney disease, and other problems. A blood pressure reading consists of a higher number over a lower number. Ideally, your blood pressure should be below 120/80. The first ("top") number is called the systolic pressure. It is a measure of the pressure in your arteries as your heart beats. The second ("bottom") number is called the diastolic pressure. It is a measure of the pressure in your arteries as the heart relaxes. What are the causes? The exact cause of this condition is not known. There are some conditions that result in or are related to high blood pressure. What increases the risk? Some risk factors for high blood pressure are under your control. The following factors may make you more likely to develop this condition:  Smoking.  Having type 2 diabetes mellitus, high cholesterol, or both.  Not getting enough exercise or physical activity.  Being overweight.  Having too much fat, sugar, calories, or salt (sodium) in your diet.  Drinking too much alcohol. Some risk factors   for high blood pressure may be difficult or impossible to change. Some of these factors include:  Having chronic kidney disease.  Having a family history of high blood pressure.  Age. Risk increases with age.  Race. You may be at higher risk if you are African American.  Gender. Men are at higher risk than women before age 73. After age 73, women are at higher risk than men.  Having obstructive sleep apnea.  Stress. What are the signs or symptoms? High blood pressure may not cause symptoms. Very high blood pressure (hypertensive crisis) may cause:  Headache.  Anxiety.  Shortness of  breath.  Nosebleed.  Nausea and vomiting.  Vision changes.  Severe chest pain.  Seizures. How is this diagnosed? This condition is diagnosed by measuring your blood pressure while you are seated, with your arm resting on a flat surface, your legs uncrossed, and your feet flat on the floor. The cuff of the blood pressure monitor will be placed directly against the skin of your upper arm at the level of your heart. It should be measured at least twice using the same arm. Certain conditions can cause a difference in blood pressure between your right and left arms. Certain factors can cause blood pressure readings to be lower or higher than normal for a short period of time:  When your blood pressure is higher when you are in a health care provider's office than when you are at home, this is called white coat hypertension. Most people with this condition do not need medicines.  When your blood pressure is higher at home than when you are in a health care provider's office, this is called masked hypertension. Most people with this condition may need medicines to control blood pressure. If you have a high blood pressure reading during one visit or you have normal blood pressure with other risk factors, you may be asked to:  Return on a different day to have your blood pressure checked again.  Monitor your blood pressure at home for 1 week or longer. If you are diagnosed with hypertension, you may have other blood or imaging tests to help your health care provider understand your overall risk for other conditions. How is this treated? This condition is treated by making healthy lifestyle changes, such as eating healthy foods, exercising more, and reducing your alcohol intake. Your health care provider may prescribe medicine if lifestyle changes are not enough to get your blood pressure under control, and if:  Your systolic blood pressure is above 130.  Your diastolic blood pressure is above  80. Your personal target blood pressure may vary depending on your medical conditions, your age, and other factors. Follow these instructions at home: Eating and drinking  Eat a diet that is high in fiber and potassium, and low in sodium, added sugar, and fat. An example eating plan is called the DASH (Dietary Approaches to Stop Hypertension) diet. To eat this way: ? Eat plenty of fresh fruits and vegetables. Try to fill one half of your plate at each meal with fruits and vegetables. ? Eat whole grains, such as whole-wheat pasta, brown rice, or whole-grain bread. Fill about one fourth of your plate with whole grains. ? Eat or drink low-fat dairy products, such as skim milk or low-fat yogurt. ? Avoid fatty cuts of meat, processed or cured meats, and poultry with skin. Fill about one fourth of your plate with lean proteins, such as fish, chicken without skin, beans, eggs, or tofu. ?  Avoid pre-made and processed foods. These tend to be higher in sodium, added sugar, and fat.  Reduce your daily sodium intake. Most people with hypertension should eat less than 1,500 mg of sodium a day.  Do not drink alcohol if: ? Your health care provider tells you not to drink. ? You are pregnant, may be pregnant, or are planning to become pregnant.  If you drink alcohol: ? Limit how much you use to:  0-1 drink a day for women.  0-2 drinks a day for men. ? Be aware of how much alcohol is in your drink. In the [place] one drink equals one 12 oz bottle of beer (355 mL), one 5 oz glass of wine (148 mL), or one 1 oz glass of hard liquor (44 mL).   Lifestyle  Work with your health care provider to maintain a healthy body weight or to lose weight. Ask what an ideal weight is for you.  Get at least 30 minutes of exercise most days of the week. Activities may include walking, swimming, or biking.  Include exercise to strengthen your muscles (resistance exercise), such as Pilates or lifting weights, as part of your  weekly exercise routine. Try to do these types of exercises for 30 minutes at least 3 days a week.  Do not use any products that contain nicotine or tobacco, such as cigarettes, e-cigarettes, and chewing tobacco. If you need help quitting, ask your health care provider.  Monitor your blood pressure at home as told by your health care provider.  Keep all follow-up visits as told by your health care provider. This is important.   Medicines  Take over-the-counter and prescription medicines only as told by your health care provider. Follow directions carefully. Blood pressure medicines must be taken as prescribed.  Do not skip doses of blood pressure medicine. Doing this puts you at risk for problems and can make the medicine less effective.  Ask your health care provider about side effects or reactions to medicines that you should watch for. Contact a health care provider if you:  Think you are having a reaction to a medicine you are taking.  Have headaches that keep coming back (recurring).  Feel dizzy.  Have swelling in your ankles.  Have trouble with your vision. Get help right away if you:  Develop a severe headache or confusion.  Have unusual weakness or numbness.  Feel faint.  Have severe pain in your chest or abdomen.  Vomit repeatedly.  Have trouble breathing. Summary  Hypertension is when the force of blood pumping through your arteries is too strong. If this condition is not controlled, it may put you at risk for serious complications.  Your personal target blood pressure may vary depending on your medical conditions, your age, and other factors. For most people, a normal blood pressure is less than 120/80.  Hypertension is treated with lifestyle changes, medicines, or a combination of both. Lifestyle changes include losing weight, eating a healthy, low-sodium diet, exercising more, and limiting alcohol. This information is not intended to replace advice given to  you by your health care provider. Make sure you discuss any questions you have with your health care provider. Document Revised: 10/03/2017 Document Reviewed: 10/03/2017 Elsevier Patient Education  2021 [place]. High Cholesterol  High cholesterol is a condition in which the blood has high levels of a white, waxy substance similar to fat (cholesterol). The liver makes all the cholesterol that the body needs. The human body needs small amounts  of cholesterol to help build cells. A person gets extra or excess cholesterol from the food that he or she eats. The blood carries cholesterol from the liver to the rest of the body. If you have high cholesterol, deposits (plaques) may build up on the walls of your arteries. Arteries are the blood vessels that carry blood away from your heart. These plaques make the arteries narrow and stiff. Cholesterol plaques increase your risk for heart attack and stroke. Work with your health care provider to keep your cholesterol levels in a healthy range. What increases the risk? The following factors may make you more likely to develop this condition:  Eating foods that are high in animal fat (saturated fat) or cholesterol.  Being overweight.  Not getting enough exercise.  A family history of high cholesterol (familial hypercholesterolemia).  Use of tobacco products.  Having diabetes. What are the signs or symptoms? There are no symptoms of this condition. How is this diagnosed? This condition may be diagnosed based on the results of a blood test.  If you are older than 66 years of age, your health care provider may check your cholesterol levels every 4-6 years.  You may be checked more often if you have high cholesterol or other risk factors for heart disease. The blood test for cholesterol measures:  "Bad" cholesterol, or LDL cholesterol. This is the main type of cholesterol that causes heart disease. The desired level is less than 100  mg/dL.  "Good" cholesterol, or HDL cholesterol. HDL helps protect against heart disease by cleaning the arteries and carrying the LDL to the liver for processing. The desired level for HDL is 60 mg/dL or higher.  Triglycerides. These are fats that your body can store or burn for energy. The desired level is less than 150 mg/dL.  Total cholesterol. This measures the total amount of cholesterol in your blood and includes LDL, HDL, and triglycerides. The desired level is less than 200 mg/dL. How is this treated? This condition may be treated with:  Diet changes. You may be asked to eat foods that have more fiber and less saturated fats or added sugar.  Lifestyle changes. These may include regular exercise, maintaining a healthy weight, and quitting use of tobacco products.  Medicines. These are given when diet and lifestyle changes have not worked. You may be prescribed a statin medicine to help lower your cholesterol levels. Follow these instructions at home: Eating and drinking  Eat a healthy, balanced diet. This diet includes: ? Daily servings of a variety of fresh, frozen, or canned fruits and vegetables. ? Daily servings of whole grain foods that are rich in fiber. ? Foods that are low in saturated fats and trans fats. These include poultry and fish without skin, lean cuts of meat, and low-fat dairy products. ? A variety of fish, especially oily fish that contain omega-3 fatty acids. Aim to eat fish at least 2 times a week.  Avoid foods and drinks that have added sugar.  Use healthy cooking methods, such as roasting, grilling, broiling, baking, poaching, steaming, and stir-frying. Do not fry your food except for stir-frying.   Lifestyle  Get regular exercise. Aim to exercise for a total of 150 minutes a week. Increase your activity level by doing activities such as gardening, walking, and taking the stairs.  Do not use any products that contain nicotine or tobacco, such as  cigarettes, e-cigarettes, and chewing tobacco. If you need help quitting, ask your health care provider.   General  instructions  Take over-the-counter and prescription medicines only as told by your health care provider.  Keep all follow-up visits as told by your health care provider. This is important. Where to find more information  American Heart Association: www.heart.org  National Heart, Lung, and Blood Institute: https://wilson-eaton.com/ Contact a health care provider if:  You have trouble achieving or maintaining a healthy diet or weight.  You are starting an exercise program.  You are unable to stop smoking. Get help right away if:  You have chest pain.  You have trouble breathing.  You have any symptoms of a stroke. "BE FAST" is an easy way to remember the main warning signs of a stroke: ? B - Balance. Signs are dizziness, sudden trouble walking, or loss of balance. ? E - Eyes. Signs are trouble seeing or a sudden change in vision. ? F - Face. Signs are sudden weakness or numbness of the face, or the face or eyelid drooping on one side. ? A - Arms. Signs are weakness or numbness in an arm. This happens suddenly and usually on one side of the body. ? S - Speech. Signs are sudden trouble speaking, slurred speech, or trouble understanding what people say. ? T - Time. Time to call emergency services. Write down what time symptoms started.  You have other signs of a stroke, such as: ? A sudden, severe headache with no known cause. ? Nausea or vomiting. ? Seizure. These symptoms may represent a serious problem that is an emergency. Do not wait to see if the symptoms will go away. Get medical help right away. Call your local emergency services (911 in the [place] Do not drive yourself to the hospital. Summary  Cholesterol plaques increase your risk for heart attack and stroke. Work with your health care provider to keep your cholesterol levels in a healthy range.  Eat a healthy,  balanced diet, get regular exercise, and maintain a healthy weight.  Do not use any products that contain nicotine or tobacco, such as cigarettes, e-cigarettes, and chewing tobacco.  Get help right away if you have any symptoms of a stroke. This information is not intended to replace advice given to you by your health care provider. Make sure you discuss any questions you have with your health care provider. Document Revised: 12/23/2018 Document Reviewed: 12/23/2018 Elsevier Patient Education  2021 [employer].

## 2020-06-14 NOTE — Assessment & Plan Note (Signed)
Discussed importance of healthy weight management Discussed diet and exercise  

## 2020-06-14 NOTE — Assessment & Plan Note (Signed)
Well controlled Continue current medications Recheck metabolic panel F/u in 6 months  

## 2020-06-14 NOTE — Assessment & Plan Note (Signed)
Continue to abstain from smoking.

## 2020-06-14 NOTE — Assessment & Plan Note (Signed)
Well controlled Continue low carb diet

## 2020-06-14 NOTE — Assessment & Plan Note (Signed)
Continues to abstain from smoking

## 2020-06-15 ENCOUNTER — Telehealth: Payer: Self-pay

## 2020-06-15 LAB — CBC WITH DIFFERENTIAL/PLATELET
Basophils Absolute: 0 10*3/uL (ref 0.0–0.2)
Basos: 1 %
EOS (ABSOLUTE): 0.1 10*3/uL (ref 0.0–0.4)
Eos: 2 %
Hematocrit: 49.1 % — ABNORMAL HIGH (ref 34.0–46.6)
Hemoglobin: 16.4 g/dL — ABNORMAL HIGH (ref 11.1–15.9)
Immature Grans (Abs): 0 10*3/uL (ref 0.0–0.1)
Immature Granulocytes: 0 %
Lymphocytes Absolute: 3.4 10*3/uL — ABNORMAL HIGH (ref 0.7–3.1)
Lymphs: 39 %
MCH: 28.2 pg (ref 26.6–33.0)
MCHC: 33.4 g/dL (ref 31.5–35.7)
MCV: 84 fL (ref 79–97)
Monocytes Absolute: 0.6 10*3/uL (ref 0.1–0.9)
Monocytes: 7 %
Neutrophils Absolute: 4.5 10*3/uL (ref 1.4–7.0)
Neutrophils: 51 %
Platelets: 229 10*3/uL (ref 150–450)
RBC: 5.82 x10E6/uL — ABNORMAL HIGH (ref 3.77–5.28)
RDW: 13.6 % (ref 11.7–15.4)
WBC: 8.7 10*3/uL (ref 3.4–10.8)

## 2020-06-15 LAB — COMPREHENSIVE METABOLIC PANEL
ALT: 28 IU/L (ref 0–32)
AST: 26 IU/L (ref 0–40)
Albumin/Globulin Ratio: 1.4 (ref 1.2–2.2)
Albumin: 4.3 g/dL (ref 3.8–4.8)
Alkaline Phosphatase: 122 IU/L — ABNORMAL HIGH (ref 44–121)
BUN/Creatinine Ratio: 12 (ref 12–28)
BUN: 9 mg/dL (ref 8–27)
Bilirubin Total: 0.3 mg/dL (ref 0.0–1.2)
CO2: 24 mmol/L (ref 20–29)
Calcium: 9.7 mg/dL (ref 8.7–10.3)
Chloride: 101 mmol/L (ref 96–106)
Creatinine, Ser: 0.73 mg/dL (ref 0.57–1.00)
Globulin, Total: 3 g/dL (ref 1.5–4.5)
Glucose: 103 mg/dL — ABNORMAL HIGH (ref 65–99)
Potassium: 4.1 mmol/L (ref 3.5–5.2)
Sodium: 140 mmol/L (ref 134–144)
Total Protein: 7.3 g/dL (ref 6.0–8.5)
eGFR: 91 mL/min/{1.73_m2} (ref >59)

## 2020-06-15 LAB — LIPID PANEL
Chol/HDL Ratio: 5 ratio — ABNORMAL HIGH (ref 0.0–4.4)
Cholesterol, Total: 184 mg/dL (ref 100–199)
HDL: 37 mg/dL — ABNORMAL LOW (ref >39)
LDL Chol Calc (NIH): 111 mg/dL — ABNORMAL HIGH (ref 0–99)
Triglycerides: 203 mg/dL — ABNORMAL HIGH (ref 0–149)
VLDL Cholesterol Cal: 36 mg/dL (ref 5–40)

## 2020-06-15 MED ORDER — ROSUVASTATIN CALCIUM 10 MG PO TABS: 10.0000 mg | ORAL_TABLET | Freq: Every day | ORAL | 1 refills | Status: DC

## 2020-06-15 NOTE — Telephone Encounter (Signed)
-----   Message from Virginia Crews, MD sent at 06/15/2020  8:11 AM EDT ----- Normal/stable labs, except cholesterol is increasing from when it was last checked.  Is patient still taking atorvastatin 40 mg daily with good compliance?  If not, recommend resuming.  If she had an issue with it, we can discuss alternative dosing.  If she is taking it regularly and cholesterol is still elevating, we can consider Crestor 10 mg daily instead of atorvastatin to lower cholesterol better

## 2020-06-15 NOTE — Telephone Encounter (Signed)
Pt states she is taking atorvastatin regularly.  She agreed to try Crestor instead.  RX was sent to [facility] on [address].  Thanks,   -Mickel Baas

## 2020-09-19 ENCOUNTER — Other Ambulatory Visit: Payer: Self-pay | Admitting: Family Medicine

## 2020-09-19 NOTE — Telephone Encounter (Signed)
Requested Prescriptions  Pending Prescriptions Disp Refills  . amLODipine (NORVASC) 10 MG tablet [Pharmacy Med Name: AMLODIPINE BESYLATE 10 MG TAB] 90 tablet 0    Sig: TAKE 1 TABLET BY MOUTH EVERY DAY     Cardiovascular:  Calcium Channel Blockers Passed - 09/19/2020 12:53 AM      Passed - Last BP in normal range    BP Readings from Last 1 Encounters:  06/14/20 128/79         Passed - Valid encounter within last 6 months    Recent Outpatient Visits          3 months ago Essential hypertension   [facility] [place], Dionne Bucy, MD   6 months ago [employer] preventive visit   [facility], Dionne Bucy, MD   1 year ago Essential hypertension   [facility] [place], Dionne Bucy, MD   2 years ago Essential hypertension   [place], Dionne Bucy, MD   2 years ago Essential hypertension   [place], Dionne Bucy, MD      Future Appointments            In 3 months [place], Dionne Bucy, MD [facility], [place]

## 2020-12-08 ENCOUNTER — Other Ambulatory Visit: Payer: Self-pay | Admitting: Family Medicine

## 2020-12-08 NOTE — Telephone Encounter (Signed)
Requested Prescriptions  Pending Prescriptions Disp Refills  . hydrochlorothiazide (HYDRODIURIL) 12.5 MG tablet [Pharmacy Med Name: HYDROCHLOROTHIAZIDE 12.5 MG TB] 90 tablet 0    Sig: TAKE 1 TABLET BY MOUTH EVERY DAY     Cardiovascular: Diuretics - Thiazide Passed - 12/08/2020  1:28 AM      Passed - Ca in normal range and within 360 days    Calcium  Date Value Ref Range Status  06/14/2020 9.7 8.7 - 10.3 mg/dL Final         Passed - Cr in normal range and within 360 days    Creatinine, Ser  Date Value Ref Range Status  06/14/2020 0.73 0.57 - 1.00 mg/dL Final         Passed - K in normal range and within 360 days    Potassium  Date Value Ref Range Status  06/14/2020 4.1 3.5 - 5.2 mmol/L Final         Passed - Na in normal range and within 360 days    Sodium  Date Value Ref Range Status  06/14/2020 140 134 - 144 mmol/L Final         Passed - Last BP in normal range    BP Readings from Last 1 Encounters:  06/14/20 128/79         Passed - Valid encounter within last 6 months    Recent Outpatient Visits          5 months ago Essential hypertension   [place], Dionne Bucy, MD   9 months ago [facility] [employer] preventive visit   [employer] [facility], Dionne Bucy, MD   1 year ago Essential hypertension   [place], Dionne Bucy, MD   2 years ago Essential hypertension   [place], Dionne Bucy, MD   2 years ago Essential hypertension   [place], Dionne Bucy, MD      Future Appointments            In 1 week Bacigalupo, Dionne Bucy, MD [facility], [facility]           . rosuvastatin (CRESTOR) 10 MG tablet [Pharmacy Med Name: ROSUVASTATIN CALCIUM 10 MG TAB] 90 tablet 1    Sig: TAKE 1 TABLET BY MOUTH EVERY DAY     Cardiovascular:  Antilipid - Statins Failed - 12/08/2020  1:28 AM      Failed - LDL in normal range and within 360 days    LDL Chol Calc  (NIH)  Date Value Ref Range Status  06/14/2020 111 (H) 0 - 99 mg/dL Final         Failed - HDL in normal range and within 360 days    HDL  Date Value Ref Range Status  06/14/2020 37 (L) >39 mg/dL Final         Failed - Triglycerides in normal range and within 360 days    Triglycerides  Date Value Ref Range Status  06/14/2020 203 (H) 0 - 149 mg/dL Final         Passed - Total Cholesterol in normal range and within 360 days    Cholesterol, Total  Date Value Ref Range Status  06/14/2020 184 100 - 199 mg/dL Final         Passed - Patient is not pregnant      Passed - Valid encounter within last 12 months    Recent Outpatient Visits  5 months ago Essential hypertension   [facility] [place], Dionne Bucy, MD   9 months ago [facility] [employer] preventive visit   [facility], Dionne Bucy, MD   1 year ago Essential hypertension   [facility] [place], Dionne Bucy, MD   2 years ago Essential hypertension   [place], Dionne Bucy, MD   2 years ago Essential hypertension   [place], Dionne Bucy, MD      Future Appointments            In 1 week Bacigalupo, Dionne Bucy, MD [facility] [place]

## 2020-12-12 ENCOUNTER — Other Ambulatory Visit: Payer: Self-pay | Admitting: Family Medicine

## 2020-12-12 NOTE — Telephone Encounter (Signed)
Requested Prescriptions  Pending Prescriptions Disp Refills  . amLODipine (NORVASC) 10 MG tablet [Pharmacy Med Name: AMLODIPINE BESYLATE 10 MG TAB] 90 tablet 0    Sig: TAKE 1 TABLET BY MOUTH EVERY DAY     Cardiovascular:  Calcium Channel Blockers Failed - 12/12/2020  8:56 AM      Failed - Valid encounter within last 6 months    Recent Outpatient Visits          6 months ago Essential hypertension   [facility] [place], Dionne Bucy, MD   9 months ago [facility] [employer] preventive visit   [facility], Dionne Bucy, MD   1 year ago Essential hypertension   [facility] [place], Dionne Bucy, MD   2 years ago Essential hypertension   [place], Dionne Bucy, MD   2 years ago Essential hypertension   [place], Dionne Bucy, MD      Future Appointments            In 1 week Bacigalupo, Dionne Bucy, MD [facility], [facility]           Passed - Last BP in normal range    BP Readings from Last 1 Encounters:  06/14/20 128/79

## 2020-12-16 ENCOUNTER — Ambulatory Visit
Admission: RE | Admit: 2020-12-16 | Discharge: 2020-12-16 | Disposition: A | Discharge status: Home / Self Care | Payer: PPO | Source: Ambulatory Visit | Attending: Family Medicine | Admitting: Family Medicine

## 2020-12-16 ENCOUNTER — Other Ambulatory Visit: Payer: Self-pay

## 2020-12-16 DIAGNOSIS — M8589 Other specified disorders of bone density and structure, multiple sites: Secondary | ICD-10-CM | POA: Diagnosis not present

## 2020-12-16 DIAGNOSIS — Z1231 Encounter for screening mammogram for malignant neoplasm of breast: Secondary | ICD-10-CM | POA: Diagnosis not present

## 2020-12-16 DIAGNOSIS — Z780 Asymptomatic menopausal state: Secondary | ICD-10-CM | POA: Insufficient documentation

## 2020-12-20 ENCOUNTER — Ambulatory Visit (INDEPENDENT_AMBULATORY_CARE_PROVIDER_SITE_OTHER): Payer: PPO | Admitting: Family Medicine

## 2020-12-20 ENCOUNTER — Other Ambulatory Visit: Payer: Self-pay

## 2020-12-20 ENCOUNTER — Encounter: Payer: Self-pay | Admitting: Family Medicine

## 2020-12-20 VITALS — BP 118/70 | HR 91 | Temp 97.9°F | Resp 16 | Wt 188.0 lb

## 2020-12-20 DIAGNOSIS — I700 Atherosclerosis of aorta: Secondary | ICD-10-CM | POA: Diagnosis not present

## 2020-12-20 DIAGNOSIS — Z23 Encounter for immunization: Secondary | ICD-10-CM

## 2020-12-20 DIAGNOSIS — E669 Obesity, unspecified: Secondary | ICD-10-CM | POA: Diagnosis not present

## 2020-12-20 DIAGNOSIS — I10 Essential (primary) hypertension: Secondary | ICD-10-CM

## 2020-12-20 DIAGNOSIS — E782 Mixed hyperlipidemia: Secondary | ICD-10-CM | POA: Diagnosis not present

## 2020-12-20 DIAGNOSIS — D582 Other hemoglobinopathies: Secondary | ICD-10-CM | POA: Diagnosis not present

## 2020-12-20 DIAGNOSIS — R7303 Prediabetes: Secondary | ICD-10-CM | POA: Diagnosis not present

## 2020-12-20 DIAGNOSIS — Z6833 Body mass index (BMI) 33.0-33.9, adult: Secondary | ICD-10-CM | POA: Diagnosis not present

## 2020-12-20 NOTE — Progress Notes (Signed)
Established patient visit   Patient: Kim Ritter   DOB: [birth year]   65 y.o. Female  MRN: [id number] Visit Date: 12/20/2020  Today's healthcare provider: Lavon Paganini, MD   Chief Complaint  Patient presents with   Hypertension   Hyperlipidemia   Subjective     Concerned about weight gain Only eating one large meal a day at dinner. Mostly snacking during the day. She has noticed that she is craving more sweet foods and is drinking more diet sodas. Currently taking care of father and has noticed an increased level of stress and disruption to her routine. 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 Tolerating medications well.  Medications: Outpatient Medications Prior to Visit  Medication Sig   amLODipine (NORVASC) 10 MG tablet TAKE 1 TABLET BY MOUTH EVERY DAY   ascorbic acid (VITAMIN C) 500 MG tablet    Biotin 10 MG TABS    Cholecalciferol (VITAMIN D3) 50 MCG (2000 UT) TABS    hydrochlorothiazide (HYDRODIURIL) 12.5 MG tablet TAKE 1 TABLET BY MOUTH EVERY DAY   Multiple Vitamin (MULTIVITAMIN WITH MINERALS) TABS tablet Take 1 tablet by mouth daily.   ranitidine (ZANTAC) 150 MG tablet    rosuvastatin (CRESTOR) 10 MG tablet TAKE 1 TABLET BY MOUTH EVERY DAY   Zinc Sulfate 220 (50 Zn) MG TABS    No facility-administered medications prior to visit.    Review of Systems  Constitutional:  Negative for activity change.  Respiratory: Negative.    Cardiovascular: Negative.   Endocrine: Negative.   All other systems reviewed and are negative.     Objective    BP 118/70 (BP Location: Left Arm, Patient Position: Sitting, Cuff Size: Large)   Pulse 91   Temp 97.9 F (36.6 C) (Oral)   Resp 16   Wt 188 lb (85.3 kg)   SpO2 99%   BMI 33.84 kg/m  {Show previous vital signs (optional):23777}  Physical Exam Vitals reviewed.  Constitutional:      General: She is not in acute distress.    Appearance: Normal appearance. She is not ill-appearing or  toxic-appearing.  HENT:     Head: Normocephalic and atraumatic.     Right Ear: External ear normal.     Left Ear: External ear normal.     Nose: Nose normal.     Mouth/Throat:     Mouth: Mucous membranes are moist.     Pharynx: Oropharynx is clear. No oropharyngeal exudate or posterior oropharyngeal erythema.  Eyes:     General: No scleral icterus.    Extraocular Movements: Extraocular movements intact.     Conjunctiva/sclera: Conjunctivae normal.     Pupils: Pupils are equal, round, and reactive to light.  Cardiovascular:     Rate and Rhythm: Normal rate and regular rhythm.     Pulses: Normal pulses.     Heart sounds: Normal heart sounds. No murmur heard.   No friction rub. No gallop.  Pulmonary:     Effort: Pulmonary effort is normal. No respiratory distress.     Breath sounds: Normal breath sounds. No wheezing or rhonchi.  Chest:     Chest wall: No tenderness.  Abdominal:     General: Abdomen is flat. There is no distension.     Palpations: Abdomen is soft.     Tenderness: There is no abdominal tenderness.  Musculoskeletal:        General: Normal range of motion.     Cervical back: Normal range of  motion and neck supple.     Right lower leg: No edema.     Left lower leg: No edema.  Skin:    General: Skin is warm and dry.     Capillary Refill: Capillary refill takes less than 2 seconds.     Findings: No lesion or rash.  Neurological:     General: No focal deficit present.     Mental Status: She is alert and oriented to person, place, and time. Mental status is at baseline.  Psychiatric:        Mood and Affect: Mood normal.      No results found for any visits on 12/20/20.  Assessment & Plan     Problem List Items Addressed This Visit       Cardiovascular and Mediastinum   Essential hypertension - Primary    Well controlled Continue amlodipine 10mg  and HCTZ 12.5mg  Recheck CMP      Relevant Orders   Comprehensive Metabolic Panel (CMET)   Aortic  atherosclerosis (HCC)    Continue risk factor management        Other   Obesity    Discussed importance of healthy weight management Discussed diet and exercise      Hyperlipidemia    Previously well controlled Continue rosuvastatin 10mg  Recheck FLP      Relevant Orders   Comprehensive Metabolic Panel (CMET)   Lipid Profile   Elevated hemoglobin (HCC)    History of tobacco use disorder, in remission Recheck CBC      Relevant Orders   CBC w/Diff/Platelet   Prediabetes    Previously well controlled Recheck HgbA1C Continue low carb diet      Relevant Orders   Hemoglobin A1c   Other Visit Diagnoses     Need for influenza vaccination            Return in about 3 months (around 03/22/2021) for CPE, AWV.      Nelva Nay, Medical Student 12/20/2020, 9:34 AM   Patient seen along with MS3 student Nelva Nay. I personally evaluated this patient along with the student, and verified all aspects of the history, physical exam, and medical decision making as documented by the student. I agree with the student's documentation and have made all necessary edits.  Vallery Mcdade, Dionne Bucy, MD, MPH [facility]

## 2020-12-20 NOTE — Assessment & Plan Note (Signed)
Previously well controlled Recheck HgbA1C Continue low carb diet

## 2020-12-20 NOTE — Assessment & Plan Note (Signed)
Discussed importance of healthy weight management Discussed diet and exercise  

## 2020-12-20 NOTE — Patient Instructions (Signed)
   The CDC recommends two doses of Shingrix (the shingles vaccine) separated by 2 to 6 months for adults age 66 years and older. I recommend checking with your insurance plan regarding coverage for this vaccine.   

## 2020-12-20 NOTE — Assessment & Plan Note (Signed)
Continue risk factor management. 

## 2020-12-20 NOTE — Assessment & Plan Note (Signed)
Previously well controlled Continue rosuvastatin 10mg  Recheck FLP

## 2020-12-20 NOTE — Assessment & Plan Note (Addendum)
Well controlled Continue amlodipine 10mg  and HCTZ 12.5mg  Recheck CMP

## 2020-12-20 NOTE — Assessment & Plan Note (Addendum)
History of tobacco use disorder, in remission Recheck CBC

## 2020-12-21 ENCOUNTER — Telehealth: Payer: Self-pay

## 2020-12-21 LAB — COMPREHENSIVE METABOLIC PANEL
ALT: 47 IU/L — ABNORMAL HIGH (ref 0–32)
AST: 40 IU/L (ref 0–40)
Albumin/Globulin Ratio: 1.4 (ref 1.2–2.2)
Albumin: 4.4 g/dL (ref 3.8–4.8)
Alkaline Phosphatase: 124 IU/L — ABNORMAL HIGH (ref 44–121)
BUN/Creatinine Ratio: 15 (ref 12–28)
BUN: 11 mg/dL (ref 8–27)
Bilirubin Total: 0.3 mg/dL (ref 0.0–1.2)
CO2: 27 mmol/L (ref 20–29)
Calcium: 9.9 mg/dL (ref 8.7–10.3)
Chloride: 98 mmol/L (ref 96–106)
Creatinine, Ser: 0.74 mg/dL (ref 0.57–1.00)
Globulin, Total: 3.1 g/dL (ref 1.5–4.5)
Glucose: 103 mg/dL — ABNORMAL HIGH (ref 70–99)
Potassium: 4.3 mmol/L (ref 3.5–5.2)
Sodium: 139 mmol/L (ref 134–144)
Total Protein: 7.5 g/dL (ref 6.0–8.5)
eGFR: 89 mL/min/{1.73_m2} (ref >59)

## 2020-12-21 LAB — CBC WITH DIFFERENTIAL/PLATELET
Basophils Absolute: 0.1 10*3/uL (ref 0.0–0.2)
Basos: 1 %
EOS (ABSOLUTE): 0.1 10*3/uL (ref 0.0–0.4)
Eos: 2 %
Hematocrit: 49.4 % — ABNORMAL HIGH (ref 34.0–46.6)
Hemoglobin: 16.2 g/dL — ABNORMAL HIGH (ref 11.1–15.9)
Immature Grans (Abs): 0 10*3/uL (ref 0.0–0.1)
Immature Granulocytes: 0 %
Lymphocytes Absolute: 3 10*3/uL (ref 0.7–3.1)
Lymphs: 34 %
MCH: 27.4 pg (ref 26.6–33.0)
MCHC: 32.8 g/dL (ref 31.5–35.7)
MCV: 84 fL (ref 79–97)
Monocytes Absolute: 0.7 10*3/uL (ref 0.1–0.9)
Monocytes: 8 %
Neutrophils Absolute: 4.8 10*3/uL (ref 1.4–7.0)
Neutrophils: 55 %
Platelets: 204 10*3/uL (ref 150–450)
RBC: 5.91 x10E6/uL — ABNORMAL HIGH (ref 3.77–5.28)
RDW: 13.2 % (ref 11.7–15.4)
WBC: 8.6 10*3/uL (ref 3.4–10.8)

## 2020-12-21 LAB — LIPID PANEL
Chol/HDL Ratio: 4.8 ratio — ABNORMAL HIGH (ref 0.0–4.4)
Cholesterol, Total: 181 mg/dL (ref 100–199)
HDL: 38 mg/dL — ABNORMAL LOW (ref >39)
LDL Chol Calc (NIH): 104 mg/dL — ABNORMAL HIGH (ref 0–99)
Triglycerides: 228 mg/dL — ABNORMAL HIGH (ref 0–149)
VLDL Cholesterol Cal: 39 mg/dL (ref 5–40)

## 2020-12-21 LAB — HEMOGLOBIN A1C
Est. average glucose Bld gHb Est-mCnc: 123 mg/dL
Hgb A1c MFr Bld: 5.9 % — ABNORMAL HIGH (ref 4.8–5.6)

## 2020-12-21 NOTE — Telephone Encounter (Signed)
Patient advised.

## 2020-12-21 NOTE — Telephone Encounter (Signed)
-----   Message from Virginia Crews, MD sent at 12/21/2020 12:07 PM EST ----- Normal/stable labs. Cholesterol is high.  The 10-year ASCVD (heart disease and stroke) risk score (Arnett DK, et al., 2019) is: 13.6%, which is high. Recommend statin (Crestor 5mg  daily).

## 2021-01-17 ENCOUNTER — Other Ambulatory Visit: Payer: Self-pay | Admitting: Family Medicine

## 2021-01-17 ENCOUNTER — Encounter: Payer: Self-pay | Admitting: Family Medicine

## 2021-03-05 ENCOUNTER — Other Ambulatory Visit: Payer: Self-pay | Admitting: Family Medicine

## 2021-03-05 NOTE — Telephone Encounter (Signed)
Requested Prescriptions  Pending Prescriptions Disp Refills   hydrochlorothiazide (HYDRODIURIL) 12.5 MG tablet [Pharmacy Med Name: HYDROCHLOROTHIAZIDE 12.5 MG TB] 90 tablet 0    Sig: TAKE 1 TABLET BY MOUTH EVERY DAY     Cardiovascular: Diuretics - Thiazide Passed - 03/05/2021  9:26 AM      Passed - Ca in normal range and within 360 days    Calcium  Date Value Ref Range Status  12/20/2020 9.9 8.7 - 10.3 mg/dL Final         Passed - Cr in normal range and within 360 days    Creatinine, Ser  Date Value Ref Range Status  12/20/2020 0.74 0.57 - 1.00 mg/dL Final         Passed - K in normal range and within 360 days    Potassium  Date Value Ref Range Status  12/20/2020 4.3 3.5 - 5.2 mmol/L Final         Passed - Na in normal range and within 360 days    Sodium  Date Value Ref Range Status  12/20/2020 139 134 - 144 mmol/L Final         Passed - Last BP in normal range    BP Readings from Last 1 Encounters:  12/20/20 118/70         Passed - Valid encounter within last 6 months    Recent Outpatient Visits          2 months ago Essential hypertension   [facility] [place], Dionne Bucy, MD   8 months ago Essential hypertension   [place], Dionne Bucy, MD   11 months ago [facility] [employer] preventive visit   [facility], Dionne Bucy, MD   1 year ago Essential hypertension   [place], Dionne Bucy, MD   2 years ago Essential hypertension   [place], Dionne Bucy, MD      Future Appointments            In 1 week M[place], Dani Gobble, PA-C [employer], [facility]

## 2021-03-17 NOTE — Progress Notes (Signed)
Annual Wellness Visit     Patient: Kim Ritter, Female    DOB: 1955/05/24, 66 y.o.   MRN: [id number] Visit Date: 03/18/2021  Today's Provider: Dani Gobble Shenell Rogalski, PA-C  Introduced myself to the patient as a PA-C and provided education on APPs in clinical practice.    I,Joseline E Rosas,acting as a scribe for [employer], PA-C.,have documented all relevant documentation on the behalf of [place], PA-C,as directed by  [employer], PA-C while in the presence of Madiha Bambrick E Saina Waage, PA-C.   Chief Complaint  Patient presents with   Annual Exam   Subjective    Kim Ritter is a 66 y.o. female who presents today for her Annual Wellness Visit. She reports consuming a general diet. The patient does not participate in regular exercise at present. She generally feels well. She reports sleeping well. She does not have additional problems to discuss today.   HPI 12/16/20-Normal mammogram. Repeat in 1 yr 07/13/14-Colonoscopy-repeat in 10 yrs  Medications: Outpatient Medications Prior to Visit  Medication Sig   amLODipine (NORVASC) 10 MG tablet TAKE 1 TABLET BY MOUTH EVERY DAY   ascorbic acid (VITAMIN C) 500 MG tablet    Biotin 10 MG TABS    Cholecalciferol (VITAMIN D3) 50 MCG (2000 UT) TABS    hydrochlorothiazide (HYDRODIURIL) 12.5 MG tablet TAKE 1 TABLET BY MOUTH EVERY DAY   Multiple Vitamin (MULTIVITAMIN WITH MINERALS) TABS tablet Take 1 tablet by mouth daily.   rosuvastatin (CRESTOR) 10 MG tablet TAKE 1 TABLET BY MOUTH EVERY DAY   Zinc Sulfate 220 (50 Zn) MG TABS    [DISCONTINUED] ranitidine (ZANTAC) 150 MG tablet    No facility-administered medications prior to visit.    No Known Allergies  Patient Care Team: Virginia Crews, MD as PCP - General (Family Medicine)  Review of Systems  Constitutional: Negative.   HENT: Negative.    Eyes: Negative.   Respiratory: Negative.    Cardiovascular: Negative.   Gastrointestinal: Negative.   Endocrine: Negative.    Genitourinary: Negative.   Musculoskeletal: Negative.   Skin: Negative.   Allergic/Immunologic: Negative.   Neurological: Negative.   Hematological: Negative.   Psychiatric/Behavioral: Negative.         Objective    Vitals: BP 114/74 (BP Location: Left Arm, Patient Position: Sitting, Cuff Size: Large)    Pulse 76    Temp 98.4 F (36.9 C) (Oral)    Resp 16    Ht 5\' 2"  (1.575 m)    Wt 187 lb 6.4 oz (85 kg)    BMI 34.28 kg/m    Physical Exam Vitals reviewed.  Constitutional:      General: She is awake.     Appearance: Normal appearance. She is well-developed and well-groomed. She is obese.  HENT:     Head: Normocephalic and atraumatic.     Right Ear: Tympanic membrane, ear canal and external ear normal.     Left Ear: Tympanic membrane, ear canal and external ear normal.     Nose: Nose normal.     Mouth/Throat:     Mouth: Mucous membranes are moist.     Pharynx: Oropharynx is clear. No oropharyngeal exudate or posterior oropharyngeal erythema.  Eyes:     Extraocular Movements: Extraocular movements intact.     Conjunctiva/sclera: Conjunctivae normal.     Pupils: Pupils are equal, round, and reactive to light.  Neck:     Thyroid: No thyroid mass, thyromegaly or thyroid tenderness.  Trachea: Trachea and phonation normal.  Cardiovascular:     Rate and Rhythm: Normal rate and regular rhythm.     Pulses: Normal pulses.     Heart sounds: Normal heart sounds.  Pulmonary:     Effort: Pulmonary effort is normal.     Breath sounds: Normal breath sounds. No decreased breath sounds, wheezing, rhonchi or rales.  Abdominal:     General: Abdomen is protuberant. Bowel sounds are normal.     Palpations: Abdomen is soft. There is no shifting dullness or hepatomegaly.     Tenderness: There is no abdominal tenderness.     Hernia: No hernia is present.  Musculoskeletal:        General: Normal range of motion.     Right shoulder: Normal strength.     Left shoulder: Normal strength.      Right hand: No deformity. Normal strength. Normal pulse.     Left hand: No deformity. Normal strength. Normal pulse.     Cervical back: Normal range of motion and neck supple.     Lumbar back: Negative right straight leg raise test and negative left straight leg raise test.     Right lower leg: No edema.     Left lower leg: No edema.  Lymphadenopathy:     Head:     Right side of head: No submental or submandibular adenopathy.     Left side of head: No submental or submandibular adenopathy.     Upper Body:     Right upper body: No supraclavicular adenopathy.     Left upper body: No supraclavicular adenopathy.  Skin:    General: Skin is warm and dry.     Capillary Refill: Capillary refill takes less than 2 seconds.  Neurological:     General: No focal deficit present.     Mental Status: She is alert and oriented to person, place, and time.     Motor: Motor function is intact. No tremor.     Deep Tendon Reflexes:     Reflex Scores:      Tricep reflexes are 1+ on the right side and 1+ on the left side.      Patellar reflexes are 1+ on the right side and 1+ on the left side. Psychiatric:        Attention and Perception: Attention and perception normal.        Mood and Affect: Mood and affect normal.        Speech: Speech normal.        Behavior: Behavior normal. Behavior is cooperative.        Thought Content: Thought content normal.        Cognition and Memory: Cognition normal.        Judgment: Judgment normal.     Most recent functional status assessment: In your present state of health, do you have any difficulty performing the following activities: 03/18/2021  Hearing? N  Vision? N  Difficulty concentrating or making decisions? N  Walking or climbing stairs? N  Dressing or bathing? N  Doing errands, shopping? N  Some recent data might be hidden   Most recent fall risk assessment: Fall Risk  03/18/2021  Falls in the past year? 0  Number falls in past yr: 0  Injury with  Fall? 0  Risk for fall due to : -  Follow up -    Most recent depression screenings: PHQ 2/9 Scores 03/18/2021 12/20/2020  PHQ - 2 Score 0 0  PHQ- 9  Score - 0   Most recent cognitive screening: 6CIT Screen 03/18/2021  What Year? 0 points  What month? 0 points  What time? 0 points  Count back from 20 0 points  Months in reverse 0 points  Repeat phrase 0 points  Total Score 0   Most recent Audit-C alcohol use screening Alcohol Use Disorder Test (AUDIT) 03/18/2021  1. How often do you have a drink containing alcohol? 0  2. How many drinks containing alcohol do you have on a typical day when you are drinking? 0  3. How often do you have six or more drinks on one occasion? 0  AUDIT-C Score 0  Alcohol Brief Interventions/Follow-up -   A score of 3 or more in women, and 4 or more in men indicates increased risk for alcohol abuse, EXCEPT if all of the points are from question 1   No results found for any visits on 03/18/21.  Assessment & Plan     Annual wellness visit done today including the all of the following: Reviewed patient's Family Medical History Reviewed and updated list of patient's medical providers Assessment of cognitive impairment was done Assessed patient's functional ability Established a written schedule for health screening [place] Completed and Reviewed  Exercise Activities and Dietary recommendations  Goals   None     Immunization History  Administered Date(s) Administered   Fluad Quad(high Dose 65+) 03/12/2020, 12/20/2020   Influenza,inj,Quad PF,6+ Mos 10/01/2017, 11/19/2018   Influenza-Unspecified 09/23/2016   Janssen (J&J) SARS-COV-2 Vaccination 07/02/2019   PNEUMOCOCCAL CONJUGATE-20 03/18/2021   Pneumococcal Polysaccharide-23 03/12/2020   Tdap 04/22/2019    Health Maintenance  Topic Date Due   Zoster Vaccines- Shingrix (1 of 2) Never done   COVID-19 Vaccine (2 - Janssen risk series) 07/30/2019   MAMMOGRAM  12/17/2022    DEXA SCAN  12/17/2022   COLONOSCOPY (Pts 45-67yrs Insurance coverage will need to be confirmed)  07/12/2024   TETANUS/TDAP  04/21/2029   Pneumonia Vaccine 13+ Years old  Completed   INFLUENZA VACCINE  Completed   Hepatitis C Screening  Completed   HPV VACCINES  Aged Out     Discussed health benefits of physical activity, and encouraged her to engage in regular exercise appropriate for her age and condition.        Problem List Items Addressed This Visit       Cardiovascular and Mediastinum   Essential hypertension    Chronic, historic condition, stable and well managed with medications Recommend increasing physical activity to assist with CV health Continue current medications for management         Other   Obesity    Chronic, historic concern Recommend increased exercise to 150 minutes of moderate intensity physical activity per week  Discussed dietary recommendations appropriate for reducing cholesterol, increasing fiber that can help with weight control      History of tobacco use disorder    Currently not smoking and does not report need for assistance with continued cessation. Lung cancer screening with low dose CT ordered for monitoring       Relevant Orders   CT CHEST LUNG CA SCREEN LOW DOSE W/O CM   Other Visit Diagnoses     Medicare annual wellness visit, subsequent    -  Primary   Need for pneumococcal vaccination       Relevant Orders   Pneumococcal conjugate vaccine 20-valent (Prevnar 20) (Completed)   Need for shingles vaccine       Relevant  Medications   Zoster Vaccine Adjuvanted [facility]) injection        No follow-ups on file.   I, Camryn Lampson E Lyndsay Talamante, PA-C, have reviewed all documentation for this visit. The documentation on 03/18/21 for the exam, diagnosis, procedures, and orders are all accurate and complete.   Kendry Pfarr, [place]ie [place] MPH [place], PA-C  [facility] [phone] (phone) [phone] (fax)  [place]

## 2021-03-18 ENCOUNTER — Ambulatory Visit: Payer: PPO | Admitting: Family Medicine

## 2021-03-18 ENCOUNTER — Other Ambulatory Visit: Payer: Self-pay

## 2021-03-18 ENCOUNTER — Ambulatory Visit (INDEPENDENT_AMBULATORY_CARE_PROVIDER_SITE_OTHER): Payer: PPO | Admitting: Physician Assistant

## 2021-03-18 ENCOUNTER — Encounter: Payer: Self-pay | Admitting: Physician Assistant

## 2021-03-18 VITALS — BP 114/74 | HR 76 | Temp 98.4°F | Resp 16 | Ht 62.0 in | Wt 187.4 lb

## 2021-03-18 DIAGNOSIS — Z6833 Body mass index (BMI) 33.0-33.9, adult: Secondary | ICD-10-CM | POA: Diagnosis not present

## 2021-03-18 DIAGNOSIS — I10 Essential (primary) hypertension: Secondary | ICD-10-CM

## 2021-03-18 DIAGNOSIS — Z0000 Encounter for general adult medical examination without abnormal findings: Secondary | ICD-10-CM | POA: Diagnosis not present

## 2021-03-18 DIAGNOSIS — Z87891 Personal history of nicotine dependence: Secondary | ICD-10-CM

## 2021-03-18 DIAGNOSIS — E669 Obesity, unspecified: Secondary | ICD-10-CM | POA: Diagnosis not present

## 2021-03-18 DIAGNOSIS — Z23 Encounter for immunization: Secondary | ICD-10-CM

## 2021-03-18 MED ORDER — SHINGRIX 50 MCG/0.5ML IM SUSR: 0.5000 mL | Freq: Once | INTRAMUSCULAR | 0 refills | Status: AC

## 2021-03-18 NOTE — Assessment & Plan Note (Signed)
Chronic, historic concern Recommend increased exercise to 150 minutes of moderate intensity physical activity per week  Discussed dietary recommendations appropriate for reducing cholesterol, increasing fiber that can help with weight control

## 2021-03-18 NOTE — Assessment & Plan Note (Signed)
Currently not smoking and does not report need for assistance with continued cessation. Lung cancer screening with low dose CT ordered for monitoring

## 2021-03-18 NOTE — Assessment & Plan Note (Signed)
Chronic, historic condition, stable and well managed with medications Recommend increasing physical activity to assist with CV health Continue current medications for management

## 2021-04-01 ENCOUNTER — Ambulatory Visit
Admission: RE | Admit: 2021-04-01 | Discharge: 2021-04-01 | Disposition: A | Discharge status: Home / Self Care | Payer: PPO | Source: Ambulatory Visit | Attending: Physician Assistant | Admitting: Physician Assistant

## 2021-04-01 ENCOUNTER — Other Ambulatory Visit: Payer: Self-pay

## 2021-04-01 DIAGNOSIS — Z87891 Personal history of nicotine dependence: Secondary | ICD-10-CM

## 2021-04-18 ENCOUNTER — Encounter: Payer: Self-pay | Admitting: Family Medicine

## 2021-04-18 ENCOUNTER — Other Ambulatory Visit: Payer: Self-pay

## 2021-04-18 ENCOUNTER — Ambulatory Visit (INDEPENDENT_AMBULATORY_CARE_PROVIDER_SITE_OTHER): Payer: PPO | Admitting: Family Medicine

## 2021-04-18 VITALS — BP 138/72 | HR 81 | Temp 97.9°F | Resp 16 | Wt 186.7 lb

## 2021-04-18 DIAGNOSIS — N3091 Cystitis, unspecified with hematuria: Secondary | ICD-10-CM

## 2021-04-18 LAB — POCT URINALYSIS DIPSTICK
Bilirubin, UA: NEGATIVE
Glucose, UA: NEGATIVE
Ketones, UA: NEGATIVE
Nitrite, UA: NEGATIVE
Protein, UA: NEGATIVE
Spec Grav, UA: 1.015 (ref 1.010–1.025)
Urobilinogen, UA: 0.2 E.U./dL
pH, UA: 6 (ref 5.0–8.0)

## 2021-04-18 MED ORDER — CEPHALEXIN 500 MG PO CAPS: 500.0000 mg | ORAL_CAPSULE | Freq: Three times a day (TID) | ORAL | 0 refills | Status: AC

## 2021-04-18 NOTE — Progress Notes (Signed)
? ?I,Sulibeya S Dimas,acting as a scribe for Lavon Paganini, MD.,have documented all relevant documentation on the behalf of Lavon Paganini, MD,as directed by  Lavon Paganini, MD while in the presence of Lavon Paganini, MD. ?  ? ? ?Established patient visit ? ? ?Patient: Kim Ritter   DOB: [birth year]   66 y.o. Female  MRN: [id number] ?Visit Date: 04/18/2021 ? ?Today's healthcare provider: Lavon Paganini, MD  ? ?Chief Complaint  ?Patient presents with  ? Urinary Tract Infection  ? ?Subjective  ?  ?HPI  ?Urinary symptoms ? ?She reports new onset cloudy malodorous urine, dysuria, flank pain, and urinary frequency. The current episode started a few days ago and is staying constant. Patient states symptoms are moderate in intensity, occurring constantly. She  has not been recently treated for similar symptoms.  ?  ?Associated symptoms: ?No abdominal pain Yes back pain  ?No chills No constipation  ?No cramping No diarrhea  ?No discharge No fever  ?No hematuria Yes nausea  ?No vomiting   ? ?------------------------------------------------------------------------- ? ? ?Medications: ?Outpatient Medications Prior to Visit  ?Medication Sig  ? amLODipine (NORVASC) 10 MG tablet TAKE 1 TABLET BY MOUTH EVERY DAY  ? ascorbic acid (VITAMIN C) 500 MG tablet   ? Biotin 10 MG TABS   ? Cholecalciferol (VITAMIN D3) 50 MCG (2000 UT) TABS   ? hydrochlorothiazide (HYDRODIURIL) 12.5 MG tablet TAKE 1 TABLET BY MOUTH EVERY DAY  ? Multiple Vitamin (MULTIVITAMIN WITH MINERALS) TABS tablet Take 1 tablet by mouth daily.  ? rosuvastatin (CRESTOR) 10 MG tablet TAKE 1 TABLET BY MOUTH EVERY DAY  ? Zinc Sulfate 220 (50 Zn) MG TABS   ? ?No facility-administered medications prior to visit.  ? ? ?Review of Systems  ?Constitutional:  Negative for chills and fever.  ?Respiratory:  Negative for shortness of breath.   ?Cardiovascular:  Negative for chest pain.  ?Gastrointestinal:  Positive for nausea. Negative for abdominal distention,  constipation, diarrhea and vomiting.  ?Genitourinary:  Positive for dysuria, flank pain and frequency.  ? ?  ?  Objective  ?  ?BP 138/72 (BP Location: Left Arm, Patient Position: Sitting, Cuff Size: Large)   Pulse 81   Temp 97.9 ?F (36.6 ?C) (Temporal)   Resp 16   Wt 186 lb 11.2 oz (84.7 kg)   SpO2 99%   BMI 34.15 kg/m?  ?BP Readings from Last 3 Encounters:  ?04/18/21 138/72  ?03/18/21 114/74  ?12/20/20 118/70  ? ?Wt Readings from Last 3 Encounters:  ?04/18/21 186 lb 11.2 oz (84.7 kg)  ?03/18/21 187 lb 6.4 oz (85 kg)  ?12/20/20 188 lb (85.3 kg)  ? ?  ? ?Physical Exam ?Vitals reviewed.  ?Constitutional:   ?   General: She is not in acute distress. ?   Appearance: She is well-developed.  ?HENT:  ?   Head: Normocephalic and atraumatic.  ?Eyes:  ?   General: No scleral icterus. ?   Conjunctiva/sclera: Conjunctivae normal.  ?Cardiovascular:  ?   Rate and Rhythm: Normal rate and regular rhythm.  ?   Heart sounds: Normal heart sounds. No murmur heard. ?Pulmonary:  ?   Effort: Pulmonary effort is normal. No respiratory distress.  ?   Breath sounds: Normal breath sounds. No wheezing or rales.  ?Abdominal:  ?   General: There is no distension.  ?   Palpations: Abdomen is soft.  ?   Tenderness: There is no abdominal tenderness. There is no right CVA tenderness, left CVA tenderness, guarding or rebound.  ?Skin: ?  General: Skin is warm and dry.  ?   Capillary Refill: Capillary refill takes less than 2 seconds.  ?   Findings: No rash.  ?Neurological:  ?   Mental Status: She is alert and oriented to person, place, and time.  ?Psychiatric:     ?   Behavior: Behavior normal.  ?  ? ? ?Results for orders placed or performed in visit on 04/18/21  ?POCT urinalysis dipstick  ?Result Value Ref Range  ? Color, UA yellow   ? Clarity, UA clear   ? Glucose, UA Negative Negative  ? Bilirubin, UA Negative   ? Ketones, UA Negative   ? Spec Grav, UA 1.015 1.010 - 1.025  ? Blood, UA small   ? pH, UA 6.0 5.0 - 8.0  ? Protein, UA Negative  Negative  ? Urobilinogen, UA 0.2 0.2 or 1.0 E.U./dL  ? Nitrite, UA Negative   ? Leukocytes, UA Moderate (2+) (A) Negative  ? Appearance    ? Odor    ? ? Assessment & Plan  ? ?1. Cystitis with hematuria ?- Symptoms and UA consistent with UTI ?-No systemic symptoms or signs of pyelonephritis ?- Given hematuria, will send urine micro to confirm and will plan to recheck urine in about 6 weeks after completion of antibiotics to ensure hematuria has cleared ?-Will start treatment with 5 day course of Keflex - no previous urine culture available to be reviewed ?-We will send urine culture to confirm sensitivities ?-Discussed return precautions  ?- POCT urinalysis dipstick ?- Urine Culture ?- Urinalysis, microscopic only  ? ?Return if symptoms worsen or fail to improve.  ?   ? ?I, Lavon Paganini, MD, have reviewed all documentation for this visit. The documentation on 04/18/21 for the exam, diagnosis, procedures, and orders are all accurate and complete. ? ? ?Virginia Crews, MD, MPH ?[place] [facility]   ?

## 2021-04-19 LAB — URINALYSIS, MICROSCOPIC ONLY
Bacteria, UA: NONE SEEN
Casts: NONE SEEN /lpf

## 2021-04-22 LAB — URINE CULTURE

## 2021-04-28 ENCOUNTER — Other Ambulatory Visit: Payer: Self-pay | Admitting: *Deleted

## 2021-04-28 DIAGNOSIS — Z87891 Personal history of nicotine dependence: Secondary | ICD-10-CM

## 2021-05-20 ENCOUNTER — Other Ambulatory Visit: Payer: Self-pay | Admitting: Family Medicine

## 2021-05-23 NOTE — Telephone Encounter (Signed)
Requested Prescriptions  ?Pending Prescriptions Disp Refills  ?? hydrochlorothiazide (HYDRODIURIL) 12.5 MG tablet [Pharmacy Med Name: HYDROCHLOROTHIAZIDE 12.5 MG TB] 90 tablet 0  ?  Sig: TAKE 1 TABLET BY MOUTH EVERY DAY  ?  ? Cardiovascular: Diuretics - Thiazide Passed - 05/20/2021  7:13 PM  ?  ?  Passed - Cr in normal range and within 180 days  ?  Creatinine, Ser  ?Date Value Ref Range Status  ?12/20/2020 0.74 0.57 - 1.00 mg/dL Final  ?   ?  ?  Passed - K in normal range and within 180 days  ?  Potassium  ?Date Value Ref Range Status  ?12/20/2020 4.3 3.5 - 5.2 mmol/L Final  ?   ?  ?  Passed - Na in normal range and within 180 days  ?  Sodium  ?Date Value Ref Range Status  ?12/20/2020 139 134 - 144 mmol/L Final  ?   ?  ?  Passed - Last BP in normal range  ?  BP Readings from Last 1 Encounters:  ?04/18/21 138/72  ?   ?  ?  Passed - Valid encounter within last 6 months  ?  Recent Outpatient Visits   ?      ? 1 month ago Cystitis with hematuria  ? [facility] [place], Dionne Bucy, MD  ? 2 months ago Medicare annual wellness visit, subsequent  ? [employer], Erin E, PA-C  ? 5 months ago Essential hypertension  ? [facility] [place], Dionne Bucy, MD  ? 11 months ago Essential hypertension  ? [facility] [place], Dionne Bucy, MD  ? 1 year ago Welcome to Medicare preventive visit  ? [facility] [place], Dionne Bucy, MD  ?  ?  ?Future Appointments   ?        ? In 2 months Bacigalupo, Dionne Bucy, MD [facility]  ? In 10 months Bacigalupo, Dionne Bucy, MD [facility]  ?  ? ?  ?  ?  ?? amLODipine (NORVASC) 10 MG tablet [Pharmacy Med Name: AMLODIPINE BESYLATE 10 MG TAB] 90 tablet 0  ?  Sig: TAKE 1 TABLET BY MOUTH EVERY DAY  ?  ? Cardiovascular: Calcium Channel Blockers 2 Passed - 05/20/2021  7:13 PM  ?  ?  Passed - Last BP in normal range  ?  BP Readings from Last 1 Encounters:  ?04/18/21 138/72  ?   ?  ?  Passed -  Last Heart Rate in normal range  ?  Pulse Readings from Last 1 Encounters:  ?04/18/21 81  ?   ?  ?  Passed - Valid encounter within last 6 months  ?  Recent Outpatient Visits   ?      ? 1 month ago Cystitis with hematuria  ? [facility] [place], Dionne Bucy, MD  ? 2 months ago Medicare annual wellness visit, subsequent  ? [employer], Erin E, PA-C  ? 5 months ago Essential hypertension  ? [facility] [place], Dionne Bucy, MD  ? 11 months ago Essential hypertension  ? [facility] Bacig[place], Dionne Bucy, MD  ? 1 year ago Welcome to Medicare preventive visit  ? [facility] [place], Dionne Bucy, MD  ?  ?  ?Future Appointments   ?        ? In 2 months Bacigalupo, Dionne Bucy, MD [facility]  ? In 10 months Bacigalupo, Dionne Bucy, MD  [facility], [facility]  ?  ? ?  ?  ?  ?? rosuvastatin (CRESTOR) 10 MG tablet [Pharmacy Med Name: ROSUVASTATIN CALCIUM 10 MG TAB] 90 tablet 1  ?  Sig: TAKE 1 TABLET BY MOUTH EVERY DAY  ?  ? Cardiovascular:  Antilipid - Statins 2 Failed - 05/20/2021  7:13 PM  ?  ?  Failed - Lipid Panel in normal range within the last 12 months  ?  Cholesterol, Total  ?Date Value Ref Range Status  ?12/20/2020 181 100 - 199 mg/dL Final  ? ?LDL Chol Calc (NIH)  ?Date Value Ref Range Status  ?12/20/2020 104 (H) 0 - 99 mg/dL Final  ? ?HDL  ?Date Value Ref Range Status  ?12/20/2020 38 (L) >39 mg/dL Final  ? ?Triglycerides  ?Date Value Ref Range Status  ?12/20/2020 228 (H) 0 - 149 mg/dL Final  ? ?  ?  ?  Passed - Cr in normal range and within 360 days  ?  Creatinine, Ser  ?Date Value Ref Range Status  ?12/20/2020 0.74 0.57 - 1.00 mg/dL Final  ?   ?  ?  Passed - Patient is not pregnant  ?  ?  Passed - Valid encounter within last 12 months  ?  Recent Outpatient Visits   ?      ? 1 month ago Cystitis with hematuria  ? [facility] [place], Dionne Bucy, MD  ? 2 months ago Medicare annual wellness visit,  subsequent  ? [employer], Erin E, PA-C  ? 5 months ago Essential hypertension  ? [facility], Dionne Bucy, MD  ? 11 months ago Essential hypertension  ? [facility] [place], Dionne Bucy, MD  ? 1 year ago Welcome to Medicare preventive visit  ? [facility] [place], Dionne Bucy, MD  ?  ?  ?Future Appointments   ?        ? In 2 months Bacigalupo, Dionne Bucy, MD [facility]  ? In 10 months Bacigalupo, Dionne Bucy, MD [facility], [facility]  ?  ? ?  ?  ?  ? ? ?

## 2021-08-05 NOTE — Progress Notes (Signed)
I,Kim Ritter,acting as a Education administrator for Kim Sprout, FNP.,have documented all relevant documentation on the behalf of Kim Sprout, FNP,as directed by  Kim Sprout, FNP while in the presence of Kim Sprout, FNP.   Established patient visit   Patient: Kim Ritter   DOB: [birth year]   66 y.o. Female  MRN: [id number] Visit Date: 08/19/2021  Today's healthcare provider: [name], FNP  Introduced to nurse practitioner role and practice setting.  All questions answered.  Discussed provider/patient relationship and expectations.   Chief Complaint  Patient presents with   Hypertension   Subjective    HPI  Hypertension, follow-up  BP Readings from Last 3 Encounters:  08/19/21 117/72  04/18/21 138/72  03/18/21 114/74   Wt Readings from Last 3 Encounters:  08/19/21 182 lb (82.6 kg)  04/18/21 186 lb 11.2 oz (84.7 kg)  03/18/21 187 lb 6.4 oz (85 kg)     She was last seen for hypertension 5 months ago.  BP at that visit was 114/74. Management since that visit includes continue medication.  She reports excellent compliance with treatment. She is not having side effects.  She is following a Regular diet. She is not exercising. She does not smoke.  Use of agents associated with hypertension: none.   Outside blood pressures are stable. Symptoms: No chest pain No chest pressure  No palpitations No syncope  No dyspnea No orthopnea  No paroxysmal nocturnal dyspnea No lower extremity edema   Pertinent labs Lab Results  Component Value Date   CHOL 181 12/20/2020   HDL 38 (L) 12/20/2020   LDLCALC 104 (H) 12/20/2020   TRIG 228 (H) 12/20/2020   CHOLHDL 4.8 (H) 12/20/2020   Lab Results  Component Value Date   NA 139 12/20/2020   K 4.3 12/20/2020   CREATININE 0.74 12/20/2020   EGFR 89 12/20/2020   GLUCOSE 103 (H) 12/20/2020     The 10-year ASCVD risk score (Arnett DK, et al., 2019) is:  13.4%  ---------------------------------------------------------------------------------------------------   Medications: Outpatient Medications Prior to Visit  Medication Sig   amLODipine (NORVASC) 10 MG tablet TAKE 1 TABLET BY MOUTH EVERY DAY   ascorbic acid (VITAMIN C) 500 MG tablet    Biotin 10 MG TABS    Cholecalciferol (VITAMIN D3) 50 MCG (2000 UT) TABS    hydrochlorothiazide (HYDRODIURIL) 12.5 MG tablet TAKE 1 TABLET BY MOUTH EVERY DAY   Multiple Vitamin (MULTIVITAMIN WITH MINERALS) TABS tablet Take 1 tablet by mouth daily.   rosuvastatin (CRESTOR) 10 MG tablet TAKE 1 TABLET BY MOUTH EVERY DAY   Zinc Sulfate 220 (50 Zn) MG TABS    No facility-administered medications prior to visit.    Review of Systems  Constitutional:  Negative for appetite change and fatigue.  Eyes:  Negative for visual disturbance.  Respiratory:  Negative for chest tightness and shortness of breath.   Cardiovascular:  Negative for chest pain, palpitations and leg swelling.  Gastrointestinal:  Negative for abdominal pain, constipation, diarrhea, nausea and vomiting.  Neurological:  Negative for light-headedness.  Psychiatric/Behavioral:  Negative for sleep disturbance. The patient is not nervous/anxious.         Objective    BP 117/72 (BP Location: Right Arm, Patient Position: Sitting, Cuff Size: Large)   Pulse 86   Temp 98 F (36.7 C) (Oral)   Resp 16   Wt 182 lb (82.6 kg)   BMI 33.29 kg/m  BP Readings from Last 3 Encounters:  08/19/21  117/72  04/18/21 138/72  03/18/21 114/74   Wt Readings from Last 3 Encounters:  08/19/21 182 lb (82.6 kg)  04/18/21 186 lb 11.2 oz (84.7 kg)  03/18/21 187 lb 6.4 oz (85 kg)      Physical Exam Vitals and nursing note reviewed.  Constitutional:      General: She is not in acute distress.    Appearance: Normal appearance. She is obese. She is not ill-appearing, toxic-appearing or diaphoretic.  HENT:     Head: Normocephalic and atraumatic.   Cardiovascular:     Rate and Rhythm: Normal rate and regular rhythm.     Pulses: Normal pulses.     Heart sounds: Normal heart sounds. No murmur heard.    No friction rub. No gallop.  Pulmonary:     Effort: Pulmonary effort is normal. No respiratory distress.     Breath sounds: Normal breath sounds. No stridor. No wheezing, rhonchi or rales.  Chest:     Chest wall: No tenderness.  Abdominal:     General: Bowel sounds are normal.     Palpations: Abdomen is soft.  Musculoskeletal:        General: No swelling, tenderness, deformity or signs of injury. Normal range of motion.     Right lower leg: No edema.     Left lower leg: No edema.  Skin:    General: Skin is warm and dry.     Capillary Refill: Capillary refill takes less than 2 seconds.     Coloration: Skin is not jaundiced or pale.     Findings: No bruising, erythema, lesion or rash.  Neurological:     General: No focal deficit present.     Mental Status: She is alert and oriented to person, place, and time. Mental status is at baseline.     Cranial Nerves: No cranial nerve deficit.     Sensory: No sensory deficit.     Motor: No weakness.     Coordination: Coordination normal.  Psychiatric:        Mood and Affect: Mood normal.        Behavior: Behavior normal.        Thought Content: Thought content normal.        Judgment: Judgment normal.      No results found for any visits on 08/19/21.  Assessment & Plan     Problem List Items Addressed This Visit       Cardiovascular and Mediastinum   Essential hypertension - Primary    Chronic, stable At goal <140/<90 Continue Norvasc 10 mg, Continue HCTZ 58.3 mg Denies complications/side effects        Other   Class 1 obesity with alveolar hypoventilation, serious comorbidity, and body mass index (BMI) of 33.0 to 33.9 in adult [facility])    Chronic, slow improvement Hx of COPD from previous tobacco abuse; not on daily controllers or PRN inhalers Body mass index is 33.29  kg/m. Discussed importance of healthy weight management Discussed diet and exercise Does not wish to start medication; aware of what to do/however, not doing it Denies binge eating/portion concerns; eats the wrong things and doesn't hydrate with water- likes sweet tea and diet colas Continue to recommend balanced, lower carb meals. Smaller meal size, adding snacks. Choosing water as drink of choice and increasing purposeful exercise. Discussed calling insurance for medication coverage, use of SMART goals, small changes ex change white for wheat bread/pasta, drinking water with meals to prevent over-eating, seeing dietician. Discussed max goal of  weight loss of 1.5-2#/week to prevent re-gain.        Return in about 7 months (around 03/22/2022) for annual examination.      Vonna Kotyk, FNP, have reviewed all documentation for this visit. The documentation on 08/19/21 for the exam, diagnosis, procedures, and orders are all accurate and complete.    Kim Ritter, [place] [phone] (phone) [phone] (fax)  [place]

## 2021-08-18 ENCOUNTER — Other Ambulatory Visit: Payer: Self-pay | Admitting: Family Medicine

## 2021-08-19 ENCOUNTER — Encounter: Payer: Self-pay | Admitting: Family Medicine

## 2021-08-19 ENCOUNTER — Ambulatory Visit: Payer: PPO | Admitting: Family Medicine

## 2021-08-19 ENCOUNTER — Ambulatory Visit (INDEPENDENT_AMBULATORY_CARE_PROVIDER_SITE_OTHER): Payer: PPO | Admitting: Family Medicine

## 2021-08-19 VITALS — BP 117/72 | HR 86 | Temp 98.0°F | Resp 16 | Wt 182.0 lb

## 2021-08-19 DIAGNOSIS — E662 Morbid (severe) obesity with alveolar hypoventilation: Secondary | ICD-10-CM

## 2021-08-19 DIAGNOSIS — Z6833 Body mass index (BMI) 33.0-33.9, adult: Secondary | ICD-10-CM

## 2021-08-19 DIAGNOSIS — I10 Essential (primary) hypertension: Secondary | ICD-10-CM | POA: Diagnosis not present

## 2021-08-19 DIAGNOSIS — E66811 Obesity, class 1: Secondary | ICD-10-CM | POA: Insufficient documentation

## 2021-08-19 NOTE — Assessment & Plan Note (Signed)
Chronic, slow improvement Hx of COPD from previous tobacco abuse; not on daily controllers or PRN inhalers Body mass index is 33.29 kg/m. Discussed importance of healthy weight management Discussed diet and exercise Does not wish to start medication; aware of what to do/however, not doing it Denies binge eating/portion concerns; eats the wrong things and doesn't hydrate with water- likes sweet tea and diet colas Continue to recommend balanced, lower carb meals. Smaller meal size, adding snacks. Choosing water as drink of choice and increasing purposeful exercise. Discussed calling insurance for medication coverage, use of SMART goals, small changes ex change white for wheat bread/pasta, drinking water with meals to prevent over-eating, seeing dietician. Discussed max goal of weight loss of 1.5-2#/week to prevent re-gain.

## 2021-08-19 NOTE — Assessment & Plan Note (Signed)
Chronic, stable At goal <140/<90 Continue Norvasc 10 mg, Continue HCTZ 46.2 mg Denies complications/side effects

## 2021-11-13 ENCOUNTER — Other Ambulatory Visit: Payer: Self-pay | Admitting: Family Medicine

## 2022-01-04 DIAGNOSIS — H2513 Age-related nuclear cataract, bilateral: Secondary | ICD-10-CM | POA: Diagnosis not present

## 2022-01-25 DIAGNOSIS — H2512 Age-related nuclear cataract, left eye: Secondary | ICD-10-CM | POA: Diagnosis not present

## 2022-02-01 ENCOUNTER — Encounter: Payer: Self-pay | Admitting: Ophthalmology

## 2022-02-13 NOTE — Discharge Instructions (Signed)

## 2022-02-14 ENCOUNTER — Ambulatory Visit: Payer: PPO | Admitting: General Practice

## 2022-02-14 ENCOUNTER — Encounter: Payer: Self-pay | Admitting: Ophthalmology

## 2022-02-14 ENCOUNTER — Other Ambulatory Visit: Payer: Self-pay

## 2022-02-14 ENCOUNTER — Ambulatory Visit
Admission: RE | Admit: 2022-02-14 | Discharge: 2022-02-14 | Disposition: A | Discharge status: Home / Self Care | Payer: PPO | Attending: Ophthalmology | Admitting: Ophthalmology

## 2022-02-14 ENCOUNTER — Encounter
Admission: RE | Disposition: A | Discharge status: Home / Self Care | Payer: Self-pay | Source: Home / Self Care | Attending: Ophthalmology

## 2022-02-14 DIAGNOSIS — E1136 Type 2 diabetes mellitus with diabetic cataract: Secondary | ICD-10-CM | POA: Insufficient documentation

## 2022-02-14 DIAGNOSIS — E119 Type 2 diabetes mellitus without complications: Secondary | ICD-10-CM | POA: Diagnosis not present

## 2022-02-14 DIAGNOSIS — H2512 Age-related nuclear cataract, left eye: Secondary | ICD-10-CM | POA: Diagnosis not present

## 2022-02-14 DIAGNOSIS — Z79899 Other long term (current) drug therapy: Secondary | ICD-10-CM | POA: Diagnosis not present

## 2022-02-14 DIAGNOSIS — Z8249 Family history of ischemic heart disease and other diseases of the circulatory system: Secondary | ICD-10-CM | POA: Insufficient documentation

## 2022-02-14 DIAGNOSIS — I10 Essential (primary) hypertension: Secondary | ICD-10-CM | POA: Insufficient documentation

## 2022-02-14 DIAGNOSIS — J449 Chronic obstructive pulmonary disease, unspecified: Secondary | ICD-10-CM | POA: Diagnosis not present

## 2022-02-14 DIAGNOSIS — Z6834 Body mass index (BMI) 34.0-34.9, adult: Secondary | ICD-10-CM | POA: Diagnosis not present

## 2022-02-14 DIAGNOSIS — Z961 Presence of intraocular lens: Secondary | ICD-10-CM | POA: Diagnosis not present

## 2022-02-14 DIAGNOSIS — E669 Obesity, unspecified: Secondary | ICD-10-CM | POA: Diagnosis not present

## 2022-02-14 HISTORY — DX: Unspecified osteoarthritis, unspecified site: M19.90

## 2022-02-14 HISTORY — PX: ANTERIOR VITRECTOMY: SHX1173

## 2022-02-14 HISTORY — DX: Essential (primary) hypertension: I10

## 2022-02-14 HISTORY — PX: CATARACT EXTRACTION W/PHACO: SHX586

## 2022-02-14 SURGERY — PHACOEMULSIFICATION, CATARACT, WITH IOL INSERTION
Anesthesia: Monitor Anesthesia Care | Site: Eye | Laterality: Left

## 2022-02-14 MED ORDER — NEOMYCIN-POLYMYXIN-DEXAMETH 3.5-10000-0.1 OP OINT
TOPICAL_OINTMENT | OPHTHALMIC | Status: DC | PRN
  Administered 2022-02-14: 1 via OPHTHALMIC

## 2022-02-14 MED ORDER — SIGHTPATH DOSE#1 BSS IO SOLN
INTRAOCULAR | Status: DC | PRN
  Administered 2022-02-14: 1 mL

## 2022-02-14 MED ORDER — BRIMONIDINE TARTRATE-TIMOLOL 0.2-0.5 % OP SOLN
OPHTHALMIC | Status: DC | PRN
  Administered 2022-02-14: 1 [drp] via OPHTHALMIC

## 2022-02-14 MED ORDER — TETRACAINE HCL 0.5 % OP SOLN
1.0000 [drp] | OPHTHALMIC | Status: DC | PRN
  Administered 2022-02-14 (×3): 1 [drp] via OPHTHALMIC

## 2022-02-14 MED ORDER — ARMC OPHTHALMIC DILATING DROPS
1.0000 | OPHTHALMIC | Status: DC | PRN
  Administered 2022-02-14 (×3): 1 via OPHTHALMIC

## 2022-02-14 MED ORDER — FENTANYL CITRATE (PF) 100 MCG/2ML IJ SOLN
INTRAMUSCULAR | Status: DC | PRN
  Administered 2022-02-14: 50 ug via INTRAVENOUS
  Administered 2022-02-14: 25 ug via INTRAVENOUS

## 2022-02-14 MED ORDER — SIGHTPATH DOSE#1 NA CHONDROIT SULF-NA HYALURON 40-17 MG/ML IO SOLN
INTRAOCULAR | Status: DC | PRN
  Administered 2022-02-14: 1 mL via INTRAOCULAR

## 2022-02-14 MED ORDER — MOXIFLOXACIN HCL 0.5 % OP SOLN
OPHTHALMIC | Status: DC | PRN
  Administered 2022-02-14: .2 mL via OPHTHALMIC

## 2022-02-14 MED ORDER — SIGHTPATH DOSE#1 BSS IO SOLN
INTRAOCULAR | Status: DC | PRN
  Administered 2022-02-14: 71 mL via OPHTHALMIC

## 2022-02-14 MED ORDER — MIDAZOLAM HCL 2 MG/2ML IJ SOLN
INTRAMUSCULAR | Status: DC | PRN
  Administered 2022-02-14: 1 mg via INTRAVENOUS

## 2022-02-14 MED ORDER — LACTATED RINGERS IV SOLN: INTRAVENOUS | Status: DC

## 2022-02-14 MED ORDER — ONDANSETRON HCL 4 MG/2ML IJ SOLN: 4.0000 mg | Freq: Once | INTRAMUSCULAR | Status: DC | PRN

## 2022-02-14 MED ORDER — SIGHTPATH DOSE#1 BSS IO SOLN
INTRAOCULAR | Status: DC | PRN
  Administered 2022-02-14: 15 mL

## 2022-02-14 MED ORDER — FENTANYL CITRATE PF 50 MCG/ML IJ SOSY: 25.0000 ug | PREFILLED_SYRINGE | INTRAMUSCULAR | Status: DC | PRN

## 2022-02-14 SURGICAL SUPPLY — 17 items
CANNULA ANT/CHMB 27G (MISCELLANEOUS) IMPLANT
CANNULA ANT/CHMB 27GA (MISCELLANEOUS) IMPLANT
CATARACT SUITE SIGHTPATH (MISCELLANEOUS) ×1 IMPLANT
FEE CATARACT SUITE SIGHTPATH (MISCELLANEOUS) ×1 IMPLANT
GLOVE SURG ENC TEXT LTX SZ8 (GLOVE) ×1 IMPLANT
GLOVE SURG TRIUMPH 8.0 PF LTX (GLOVE) ×1 IMPLANT
LENS IOL ACRSF MP 22.0 (Intraocular Lens) IMPLANT
LENS IOL ACRYSOF POST 22.0 (Intraocular Lens) ×1 IMPLANT
LENS IOL TECNIS EYHANCE 22.0 (Intraocular Lens) IMPLANT
NDL FILTER BLUNT 18X1 1/2 (NEEDLE) ×1 IMPLANT
NEEDLE FILTER BLUNT 18X1 1/2 (NEEDLE) ×1 IMPLANT
PACK VIT ANT 23G (MISCELLANEOUS) IMPLANT
RING MALYGIN (MISCELLANEOUS) IMPLANT
SUT ETHILON 10-0 CS-B-6CS-B-6 (SUTURE) ×1
SUTURE EHLN 10-0 CS-B-6CS-B-6 (SUTURE) IMPLANT
SYR 3ML LL SCALE MARK (SYRINGE) ×1 IMPLANT
WATER STERILE IRR 250ML POUR (IV SOLUTION) ×1 IMPLANT

## 2022-02-14 NOTE — Anesthesia Preprocedure Evaluation (Signed)
Anesthesia Evaluation  Patient identified by MRN, date of birth, ID band Patient awake    Reviewed: Allergy & Precautions, H&P , NPO status , Patient's Chart, lab work & pertinent test results, reviewed documented beta blocker date and time   Airway Mallampati: II  TM Distance: >3 FB Neck ROM: full    Dental no notable dental hx. (+) Teeth Intact   Pulmonary COPD, former smoker   Pulmonary exam normal breath sounds clear to auscultation       Cardiovascular Exercise Tolerance: Good hypertension, On Medications negative cardio ROS  Rhythm:regular Rate:Normal     Neuro/Psych negative neurological ROS  negative psych ROS   GI/Hepatic negative GI ROS, Neg liver ROS,,,  Endo/Other  negative endocrine ROSdiabetes, Well Controlled    Renal/GU      Musculoskeletal   Abdominal   Peds  Hematology negative hematology ROS (+)   Anesthesia Other Findings   Reproductive/Obstetrics negative OB ROS                             Anesthesia Physical Anesthesia Plan  ASA: 3  Anesthesia Plan: MAC   Post-op Pain Management:    Induction:   PONV Risk Score and Plan:   Airway Management Planned:   Additional Equipment:   Intra-op Plan:   Post-operative Plan:   Informed Consent: I have reviewed the patients History and Physical, chart, labs and discussed the procedure including the risks, benefits and alternatives for the proposed anesthesia with the patient or authorized representative who has indicated his/her understanding and acceptance.       Plan Discussed with: CRNA  Anesthesia Plan Comments:        Anesthesia Quick Evaluation

## 2022-02-14 NOTE — Op Note (Signed)
PREOPERATIVE DIAGNOSIS:  Nuclear sclerotic cataract of the left eye.   POSTOPERATIVE DIAGNOSIS:  Nuclear sclerotic cataract of the left eye.   OPERATIVE PROCEDURE:ORPROCALL@   SURGEON:  Kim Robson, MD.   ANESTHESIA:  Anesthesiologist: Molli Barrows, MD CRNA: Gigi Gin, CRNA  1.      Managed anesthesia care. 2.     0.40m of Shugarcaine was instilled following the paracentesis   COMPLICATIONS: As the last quadrant of the lens was being removed, a rent in the post capsule was noted. About 1/8th of the nucleus descended into the vitreous. Most os the cortex was removed. The main incision was closed and a second para was created. Anterior vitrectomy was performed. The anterior capsule was inspected and found to be intact.  A sulcus lens was placed. During clean up of the anterior chamber the lens descended into the vitreous.  The main inscison was closed with 10-0 nylon. The eye was found to be water tight.  Vigamox ws placed in the eye. Maxitrol ointment was placed on the eye.    TECHNIQUE:   Stop and chop   DESCRIPTION OF PROCEDURE:  The patient was examined and consented in the preoperative holding area where the aforementioned topical anesthesia was applied to the left eye and then brought back to the Operating Room where the left eye was prepped and draped in the usual sterile ophthalmic fashion and a lid speculum was placed. A paracentesis was created with the side port blade and the anterior chamber was filled with viscoelastic. A near clear corneal incision was performed with the steel keratome. A continuous curvilinear capsulorrhexis was performed with a cystotome followed by the capsulorrhexis forceps. Hydrodissection and hydrodelineation were carried out with BSS on a blunt cannula. The lens was removed in a stop and chop  technique and the remaining cortical material was removed with the irrigation-aspiration handpiece.  The wounds were hydrated. The anterior chamber was  flushed with BSS and the eye was inflated to physiologic pressure. 0.116mVigamox was placed in the anterior chamber. The wounds were found to be water tight. The eye was dressed with Combigan. The patient was given a shield to wear throughout the day and a shield with which to sleep tonight. The patient was also given drops with which to begin a drop regimen today and will follow-up with Dr ZhRoosevelt Locksoday.  The events of the operation were discussed in details with the patient and her spouse. Guarded prognosis also discussed. Implant Name Type Inv. Item Serial No. Manufacturer Lot No. LRB No. Used Action  LENS IOL ACRYSOF POST 22.0 - [id number] Lens LENS IOL ACRYSOF POST 22.0 [id number] Center[facility] 1 Implanted  LENS IOL TECNIS EYHANCE 22.0 - [id number] Lens LENS IOL TECNIS EYHANCE 22.0 [id number]  Left 1 Wasted    Procedure(s) with comments: CATARACT EXTRACTION PHACO AND INTRAOCULAR LENS PLACEMENT (IOC) LEFT (Left) - 6.42 0:50.9 ANTERIOR VITRECTOMY (Left)  Electronically signed: WiBirder Ritter/10/2022 1:10 PM

## 2022-02-14 NOTE — Transfer of Care (Signed)
Immediate Anesthesia Transfer of Care Note  Patient: Kim Ritter  Procedure(s) Performed: CATARACT EXTRACTION PHACO AND INTRAOCULAR LENS PLACEMENT (IOC) LEFT (Left: Eye) ANTERIOR VITRECTOMY (Left: Eye)  Patient Location: PACU  Anesthesia Type:MAC  Level of Consciousness: awake, alert , and oriented  Airway & Oxygen Therapy: Patient Spontanous Breathing  Post-op Assessment: Report given to RN  Post vital signs: Reviewed and stable   Last Vitals:  Vitals Value Taken Time  BP 137/82 02/14/22 1308  Temp 36.4 C 02/14/22 1308  Pulse 77 02/14/22 1310  Resp 22 02/14/22 1310  SpO2 93 % 02/14/22 1310  Vitals shown include unvalidated device data.  Last Pain:  Vitals:   02/14/22 1308  TempSrc:   PainSc: 0-No pain         Complications: No notable events documented.

## 2022-02-14 NOTE — H&P (Signed)
[facility]   Primary Care Physician:  Virginia Crews, MD Ophthalmologist: Dr. George Ina  Pre-Procedure History & Physical: HPI:  Kim Ritter is a 67 y.o. female here for cataract surgery.   Past Medical History:  Diagnosis Date   Arthritis    Hypertension     Past Surgical History:  Procedure Laterality Date   BREAST REDUCTION SURGERY Bilateral 1998   COLONOSCOPY WITH PROPOFOL N/A 07/13/2014   Procedure: COLONOSCOPY WITH PROPOFOL;  Surgeon: Josefine Class, MD;  Location: [facility] END[facility]COPY;  Service: Endoscopy;  Laterality: N/A;   HEMORROIDECTOMY     REDUCTION MAMMAPLASTY      Prior to Admission medications   Medication Sig Start Date End Date Taking? Authorizing Provider  amLODipine (NORVASC) 10 MG tablet TAKE 1 TABLET BY MOUTH EVERY DAY 11/14/21  Yes Bacigalupo, Dionne Bucy, MD  ascorbic acid (VITAMIN C) 500 MG tablet    Yes [provider]  Biotin 10 MG TABS    Yes [provider]  Cholecalciferol (VITAMIN D3) 50 MCG (2000 UT) TABS    Yes [provider]  hydrochlorothiazide (HYDRODIURIL) 12.5 MG tablet TAKE 1 TABLET BY MOUTH EVERY DAY 11/14/21  Yes Bacigalupo, Dionne Bucy, MD  Multiple Vitamin (MULTIVITAMIN WITH MINERALS) TABS tablet Take 1 tablet by mouth daily.   Yes [provider]  rosuvastatin (CRESTOR) 10 MG tablet TAKE 1 TABLET BY MOUTH EVERY DAY 11/14/21  Yes Bacigalupo, Dionne Bucy, MD  Zinc Sulfate 220 (50 Zn) MG TABS    Yes [provider]    Allergies as of 01/06/2022   (No Known Allergies)    Family History  Problem Relation Age of Onset   Congestive Heart Failure Mother    Kidney failure Mother    Melanoma Mother    Breast cancer Neg Hx     Social History   Socioeconomic History   Marital status: Married    Spouse name: Legrand Como   Number of children: 2   Years of education: HS   Highest education level: Not on file  Occupational History    Employer: SELF EMPLOYED  Tobacco Use   Smoking  status: Former    Packs/day: 0.50    Years: 30.00    Total pack years: 15.00    Types: Cigarettes    Quit date: 01/2018    Years since quitting: 4.1   Smokeless tobacco: Never  Vaping Use   Vaping Use: Never used  Substance and Sexual Activity   Alcohol use: No   Drug use: No   Sexual activity: Yes    Birth control/protection: Post-menopausal  Other Topics Concern   Not on file  Social History Narrative   Not on file   Social Determinants of Health   Financial Resource Strain: Not on file  Food Insecurity: Not on file  Transportation Needs: Not on file  Physical Activity: Not on file  Stress: Not on file  Social Connections: Not on file  Intimate Partner Violence: Not on file    Review of Systems: See HPI, otherwise negative ROS  Physical Exam: BP 123/78   Pulse 79   Temp (!) 97.5 F (36.4 C) (Temporal)   Resp 18   Ht '5\' 2"'$  (1.575 m)   Wt 85.5 kg   SpO2 96%   BMI 34.46 kg/m  General:   Alert, cooperative in NAD Head:  Normocephalic and atraumatic. Respiratory:  Normal work of breathing. Cardiovascular:  RRR  Impression/Plan: Kim Ritter is here for cataract surgery.  Risks, benefits, limitations, and alternatives regarding cataract surgery have been reviewed with the patient.  Questions have been answered.  All parties agreeable.   Birder Robson, MD  02/14/2022, 12:08 PM

## 2022-02-15 ENCOUNTER — Encounter: Payer: Self-pay | Admitting: Ophthalmology

## 2022-02-15 NOTE — Anesthesia Postprocedure Evaluation (Signed)
Anesthesia Post Note  Patient: Kim Ritter  Procedure(s) Performed: CATARACT EXTRACTION PHACO AND INTRAOCULAR LENS PLACEMENT (IOC) LEFT (Left: Eye) ANTERIOR VITRECTOMY (Left: Eye)  Patient location during evaluation: PACU Anesthesia Type: MAC Level of consciousness: awake and alert Pain management: pain level controlled Vital Signs Assessment: post-procedure vital signs reviewed and stable Respiratory status: spontaneous breathing, nonlabored ventilation, respiratory function stable and patient connected to nasal cannula oxygen Cardiovascular status: stable and blood pressure returned to baseline Postop Assessment: no apparent nausea or vomiting Anesthetic complications: no   No notable events documented.   Last Vitals:  Vitals:   02/14/22 1300 02/14/22 1308  BP: (!) 144/79 137/82  Pulse: 78 75  Resp: 18 16  Temp: (!) 36.4 C (!) 36.4 C  SpO2: 95% 94%    Last Pain:  Vitals:   02/14/22 1308  TempSrc:   PainSc: 0-No pain                 Molli Barrows

## 2022-02-20 DIAGNOSIS — Z961 Presence of intraocular lens: Secondary | ICD-10-CM | POA: Diagnosis not present

## 2022-02-20 DIAGNOSIS — H27122 Anterior dislocation of lens, left eye: Secondary | ICD-10-CM | POA: Diagnosis not present

## 2022-02-20 DIAGNOSIS — H59022 Cataract (lens) fragments in eye following cataract surgery, left eye: Secondary | ICD-10-CM | POA: Diagnosis not present

## 2022-02-28 ENCOUNTER — Encounter: Admission: RE | Payer: Self-pay | Source: Home / Self Care

## 2022-02-28 ENCOUNTER — Ambulatory Visit: Admission: RE | Admit: 2022-02-28 | Payer: PPO | Source: Home / Self Care | Admitting: Ophthalmology

## 2022-02-28 SURGERY — PHACOEMULSIFICATION, CATARACT, WITH IOL INSERTION
Anesthesia: Topical | Laterality: Right

## 2022-03-01 DIAGNOSIS — I10 Essential (primary) hypertension: Secondary | ICD-10-CM | POA: Diagnosis not present

## 2022-03-01 DIAGNOSIS — T8522XA Displacement of intraocular lens, initial encounter: Secondary | ICD-10-CM | POA: Diagnosis not present

## 2022-03-01 DIAGNOSIS — Z955 Presence of coronary angioplasty implant and graft: Secondary | ICD-10-CM | POA: Diagnosis not present

## 2022-03-01 DIAGNOSIS — Z79899 Other long term (current) drug therapy: Secondary | ICD-10-CM | POA: Diagnosis not present

## 2022-03-01 DIAGNOSIS — H59022 Cataract (lens) fragments in eye following cataract surgery, left eye: Secondary | ICD-10-CM | POA: Diagnosis not present

## 2022-03-01 DIAGNOSIS — E785 Hyperlipidemia, unspecified: Secondary | ICD-10-CM | POA: Diagnosis not present

## 2022-03-10 DIAGNOSIS — T8522XD Displacement of intraocular lens, subsequent encounter: Secondary | ICD-10-CM | POA: Diagnosis not present

## 2022-03-17 NOTE — Progress Notes (Unsigned)
I,Aariana Shankland S Betul Brisky,acting as a Education administrator for Lavon Paganini, MD.,have documented all relevant documentation on the behalf of Lavon Paganini, MD,as directed by  Lavon Paganini, MD while in the presence of Lavon Paganini, MD.    Annual Wellness Visit     Patient: Kim Ritter, Female    DOB: [birth year], 67 y.o.   MRN: [id number] Visit Date: 03/20/2022  Today's Provider: Lavon Paganini, MD   No chief complaint on file.  Subjective    Kim Ritter is a 67 y.o. female who presents today for her Annual Wellness Visit. She reports consuming a {diet types:17450} diet. {Exercise:19826} She generally feels {well/fairly well/poorly:18703}. She reports sleeping {well/fairly well/poorly:18703}. She {does/does not:200015} have additional problems to discuss today.   HPI  Medications: Outpatient Medications Prior to Visit  Medication Sig   amLODipine (NORVASC) 10 MG tablet TAKE 1 TABLET BY MOUTH EVERY DAY   ascorbic acid (VITAMIN C) 500 MG tablet    Biotin 10 MG TABS    Cholecalciferol (VITAMIN D3) 50 MCG (2000 UT) TABS    hydrochlorothiazide (HYDRODIURIL) 12.5 MG tablet TAKE 1 TABLET BY MOUTH EVERY DAY   Multiple Vitamin (MULTIVITAMIN WITH MINERALS) TABS tablet Take 1 tablet by mouth daily.   rosuvastatin (CRESTOR) 10 MG tablet TAKE 1 TABLET BY MOUTH EVERY DAY   Zinc Sulfate 220 (50 Zn) MG TABS    No facility-administered medications prior to visit.    No Known Allergies  Patient Care Team: Virginia Crews, MD as PCP - General (Family Medicine)  Review of Systems  All other systems reviewed and are negative.   {Labs  Heme  Chem  Endocrine  Serology  Results Review (optional):23779}    Objective    Vitals: There were no vitals taken for this visit. {Show previous vital signs (optional):23777}   Physical Exam ***  Most recent functional status assessment:    04/18/2021    1:16 PM  In your present state of health, do you have any difficulty  performing the following activities:  Hearing? 0  Vision? 0  Difficulty concentrating or making decisions? 0  Walking or climbing stairs? 0  Dressing or bathing? 0  Doing errands, shopping? 0   Most recent fall risk assessment:    04/18/2021    1:16 PM  [place] in the past year? 0  Number falls in past yr: 0  Injury with Fall? 0  Risk for fall due to : No Fall Risks  Follow up Falls evaluation completed    Most recent depression screenings:    04/18/2021    1:16 PM 03/18/2021    9:39 AM  PHQ 2/9 Scores  PHQ - 2 Score 0 0  PHQ- 9 Score 1    Most recent cognitive screening:    03/18/2021    9:39 AM  6CIT Screen  What Year? 0 points  What month? 0 points  What time? 0 points  Count back from 20 0 points  Months in reverse 0 points  Repeat phrase 0 points  Total Score 0 points   Most recent Audit-C alcohol use screening    04/18/2021    1:16 PM  Alcohol Use Disorder Test (AUDIT)  1. How often do you have a drink containing alcohol? 0  2. How many drinks containing alcohol do you have on a typical day when you are drinking? 0  3. How often do you have six or more drinks on one occasion? 0  AUDIT-C Score 0  A score of 3 or more in women, and 4 or more in men indicates increased risk for alcohol abuse, EXCEPT if all of the points are from question 1   No results found for any visits on 03/20/22.  Assessment & Plan     Annual wellness visit done today including the all of the following: Reviewed patient's Family Medical History Reviewed and updated list of patient's medical providers Assessment of cognitive impairment was done Assessed patient's functional ability Established a written schedule for health screening [place] Completed and Reviewed  Exercise Activities and Dietary recommendations  Goals   None     Immunization History  Administered Date(s) Administered   Fluad Quad(high Dose 65+) 03/12/2020, 12/20/2020    Influenza,inj,Quad PF,6+ Mos 10/01/2017, 11/19/2018   Influenza-Unspecified 09/23/2016   Janssen (J&J) SARS-COV-2 Vaccination 07/02/2019   PNEUMOCOCCAL CONJUGATE-20 03/18/2021   Pneumococcal Polysaccharide-23 03/12/2020   Tdap 04/22/2019    Health Maintenance  Topic Date Due   Zoster Vaccines- Shingrix (1 of 2) Never done   INFLUENZA VACCINE  09/06/2021   COVID-19 Vaccine (2 - 2023-24 season) 10/07/2021   Medicare Annual Wellness (AWV)  03/18/2022   MAMMOGRAM  12/17/2022   DEXA SCAN  12/17/2022   COLONOSCOPY (Pts 45-58yr Insurance coverage will need to be confirmed)  07/12/2024   DTaP/Tdap/Td (2 - Td or Tdap) 04/21/2029   Pneumonia Vaccine 68 Years old  Completed   Hepatitis C Screening  Completed   HPV VACCINES  Aged Out   Lung Cancer Screening  Discontinued     Discussed health benefits of physical activity, and encouraged her to engage in regular exercise appropriate for her age and condition.    ***  No follow-ups on file.     {provider attestation***:1}   ALavon Paganini MD  [phone](phone) [phone](fax)  [place]

## 2022-03-20 ENCOUNTER — Encounter: Payer: PPO | Admitting: Family Medicine

## 2022-03-20 ENCOUNTER — Encounter: Payer: Self-pay | Admitting: Family Medicine

## 2022-03-20 ENCOUNTER — Ambulatory Visit (INDEPENDENT_AMBULATORY_CARE_PROVIDER_SITE_OTHER): Payer: PPO | Admitting: Family Medicine

## 2022-03-20 VITALS — BP 138/84 | HR 91 | Temp 98.0°F | Resp 16 | Ht 62.5 in | Wt 188.6 lb

## 2022-03-20 DIAGNOSIS — R7303 Prediabetes: Secondary | ICD-10-CM | POA: Diagnosis not present

## 2022-03-20 DIAGNOSIS — D582 Other hemoglobinopathies: Secondary | ICD-10-CM | POA: Diagnosis not present

## 2022-03-20 DIAGNOSIS — Z1231 Encounter for screening mammogram for malignant neoplasm of breast: Secondary | ICD-10-CM

## 2022-03-20 DIAGNOSIS — Z0000 Encounter for general adult medical examination without abnormal findings: Secondary | ICD-10-CM | POA: Diagnosis not present

## 2022-03-20 DIAGNOSIS — I10 Essential (primary) hypertension: Secondary | ICD-10-CM | POA: Diagnosis not present

## 2022-03-20 DIAGNOSIS — E782 Mixed hyperlipidemia: Secondary | ICD-10-CM

## 2022-03-20 DIAGNOSIS — Z6833 Body mass index (BMI) 33.0-33.9, adult: Secondary | ICD-10-CM

## 2022-03-20 DIAGNOSIS — E669 Obesity, unspecified: Secondary | ICD-10-CM

## 2022-03-20 DIAGNOSIS — Z23 Encounter for immunization: Secondary | ICD-10-CM | POA: Diagnosis not present

## 2022-03-20 DIAGNOSIS — I700 Atherosclerosis of aorta: Secondary | ICD-10-CM

## 2022-03-20 DIAGNOSIS — J439 Emphysema, unspecified: Secondary | ICD-10-CM

## 2022-03-20 NOTE — Assessment & Plan Note (Signed)
Well controlled Continue current medications Recheck metabolic panel F/u in 6 months  

## 2022-03-20 NOTE — Assessment & Plan Note (Signed)
Discussed importance of healthy weight management Discussed diet and exercise  

## 2022-03-20 NOTE — Assessment & Plan Note (Signed)
Likely due to smoking history Recheck CBC

## 2022-03-20 NOTE — Assessment & Plan Note (Signed)
Recommend low carb diet °Recheck A1c  °

## 2022-03-20 NOTE — Assessment & Plan Note (Signed)
Chronic and stable Upcoming lung cancer screen

## 2022-03-20 NOTE — Assessment & Plan Note (Signed)
Continue statin. 

## 2022-03-20 NOTE — Assessment & Plan Note (Signed)
Previously well controlled Continue statin Repeat FLP and CMP  

## 2022-03-21 ENCOUNTER — Telehealth: Payer: Self-pay

## 2022-03-21 DIAGNOSIS — I10 Essential (primary) hypertension: Secondary | ICD-10-CM

## 2022-03-21 LAB — COMPREHENSIVE METABOLIC PANEL
ALT: 75 IU/L — ABNORMAL HIGH (ref 0–32)
AST: 51 IU/L — ABNORMAL HIGH (ref 0–40)
Albumin/Globulin Ratio: 1.6 (ref 1.2–2.2)
Albumin: 4.5 g/dL (ref 3.9–4.9)
Alkaline Phosphatase: 114 IU/L (ref 44–121)
BUN/Creatinine Ratio: 16 (ref 12–28)
BUN: 12 mg/dL (ref 8–27)
Bilirubin Total: 0.4 mg/dL (ref 0.0–1.2)
CO2: 25 mmol/L (ref 20–29)
Calcium: 9.6 mg/dL (ref 8.7–10.3)
Chloride: 100 mmol/L (ref 96–106)
Creatinine, Ser: 0.75 mg/dL (ref 0.57–1.00)
Globulin, Total: 2.9 g/dL (ref 1.5–4.5)
Glucose: 105 mg/dL — ABNORMAL HIGH (ref 70–99)
Potassium: 4.1 mmol/L (ref 3.5–5.2)
Sodium: 140 mmol/L (ref 134–144)
Total Protein: 7.4 g/dL (ref 6.0–8.5)
eGFR: 87 mL/min/{1.73_m2} (ref >59)

## 2022-03-21 LAB — CBC WITH DIFFERENTIAL/PLATELET
Basophils Absolute: 0 10*3/uL (ref 0.0–0.2)
Basos: 1 %
EOS (ABSOLUTE): 0.1 10*3/uL (ref 0.0–0.4)
Eos: 1 %
Hematocrit: 50.8 % — ABNORMAL HIGH (ref 34.0–46.6)
Hemoglobin: 16.1 g/dL — ABNORMAL HIGH (ref 11.1–15.9)
Immature Grans (Abs): 0 10*3/uL (ref 0.0–0.1)
Immature Granulocytes: 0 %
Lymphocytes Absolute: 2.7 10*3/uL (ref 0.7–3.1)
Lymphs: 32 %
MCH: 26.4 pg — ABNORMAL LOW (ref 26.6–33.0)
MCHC: 31.7 g/dL (ref 31.5–35.7)
MCV: 83 fL (ref 79–97)
Monocytes Absolute: 0.5 10*3/uL (ref 0.1–0.9)
Monocytes: 6 %
Neutrophils Absolute: 5.1 10*3/uL (ref 1.4–7.0)
Neutrophils: 60 %
Platelets: 220 10*3/uL (ref 150–450)
RBC: 6.1 x10E6/uL — ABNORMAL HIGH (ref 3.77–5.28)
RDW: 13.4 % (ref 11.7–15.4)
WBC: 8.4 10*3/uL (ref 3.4–10.8)

## 2022-03-21 LAB — LIPID PANEL
Chol/HDL Ratio: 4 ratio (ref 0.0–4.4)
Cholesterol, Total: 156 mg/dL (ref 100–199)
HDL: 39 mg/dL — ABNORMAL LOW (ref >39)
LDL Chol Calc (NIH): 87 mg/dL (ref 0–99)
Triglycerides: 175 mg/dL — ABNORMAL HIGH (ref 0–149)
VLDL Cholesterol Cal: 30 mg/dL (ref 5–40)

## 2022-03-21 LAB — HEMOGLOBIN A1C
Est. average glucose Bld gHb Est-mCnc: 128 mg/dL
Hgb A1c MFr Bld: 6.1 % — ABNORMAL HIGH (ref 4.8–5.6)

## 2022-03-21 NOTE — Telephone Encounter (Signed)
-----   Message from Virginia Crews, MD sent at 03/21/2022  8:14 AM EST ----- Normal/stable labs, except elevation of liver function tests.  Recommend cutting back on tylenol and alcohol and hydrate well.  Recheck LFTs in 1 month.

## 2022-04-03 ENCOUNTER — Ambulatory Visit
Admission: RE | Admit: 2022-04-03 | Discharge: 2022-04-03 | Disposition: A | Discharge status: Home / Self Care | Payer: PPO | Source: Ambulatory Visit | Attending: Acute Care | Admitting: Acute Care

## 2022-04-03 DIAGNOSIS — I2510 Atherosclerotic heart disease of native coronary artery without angina pectoris: Secondary | ICD-10-CM | POA: Diagnosis not present

## 2022-04-03 DIAGNOSIS — J432 Centrilobular emphysema: Secondary | ICD-10-CM | POA: Diagnosis not present

## 2022-04-03 DIAGNOSIS — Z87891 Personal history of nicotine dependence: Secondary | ICD-10-CM | POA: Diagnosis not present

## 2022-04-03 DIAGNOSIS — J8410 Pulmonary fibrosis, unspecified: Secondary | ICD-10-CM | POA: Diagnosis not present

## 2022-04-03 IMAGING — CT CT CHEST LUNG CANCER SCREENING LOW DOSE W/O CM
1 of 2 series · 15 of 32 positions shown, 19 images · non-contrast
Comparison: 04/07/2020 screening chest CT.

CLINICAL DATA: 65-year-old asymptomatic female former smoker with
66 pack-year smoking history, quit smoking 4 years prior.



[Series 4: lcs over 30 1.0 st · axial · 0.78mm/px · z∈[-373,-67]mm · 15 of 338 slices shown, 19 images]
[im 16/338  mediastinal]
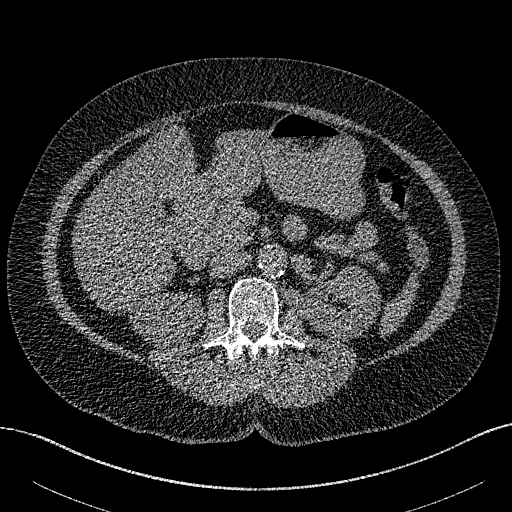
[im 16/338  lung]
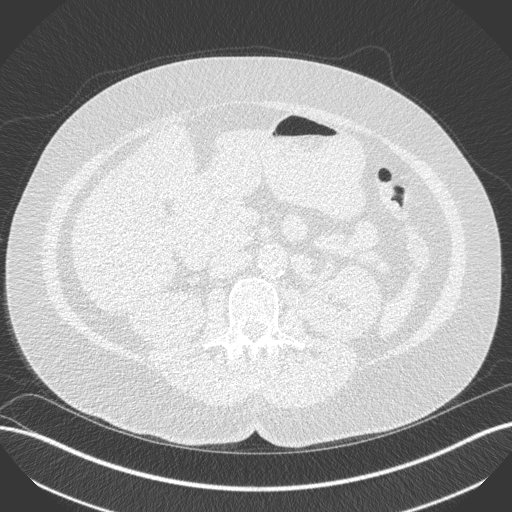
[im 46/338  lung]
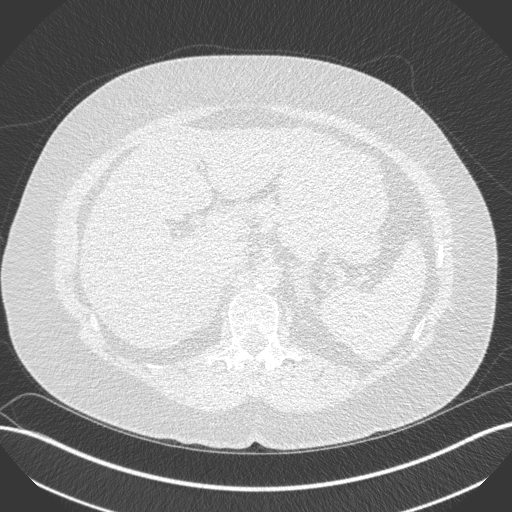
[im 77/338  lung]
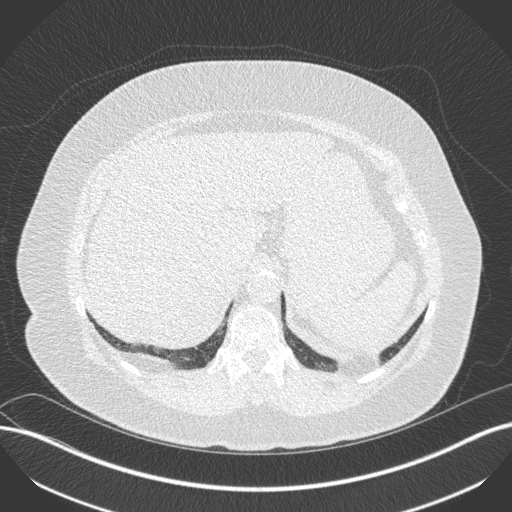
[im 85/338  lung]
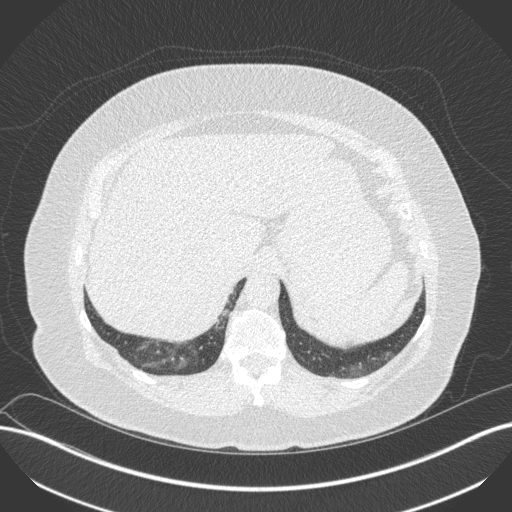
[im 108/338  mediastinal]
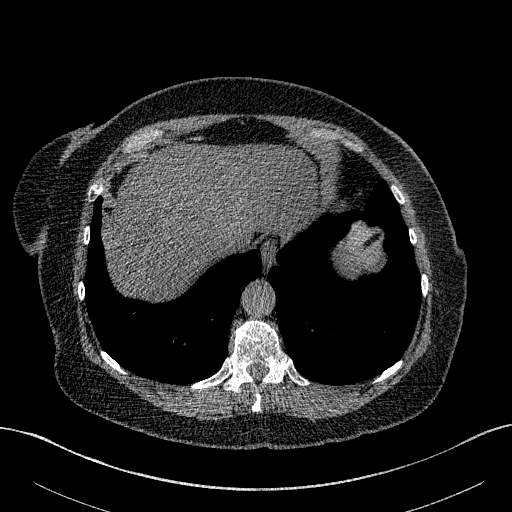
[im 108/338  lung]
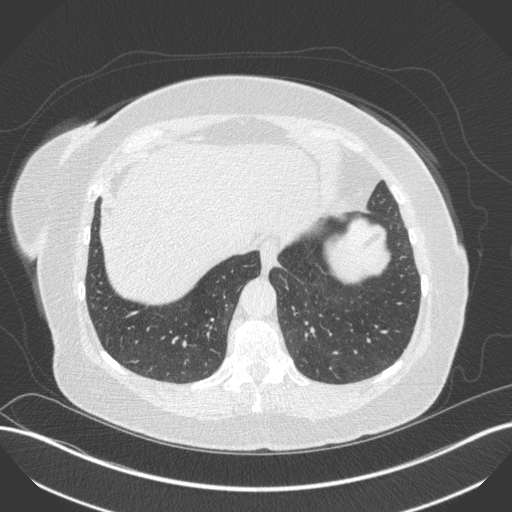
[im 123/338  lung]
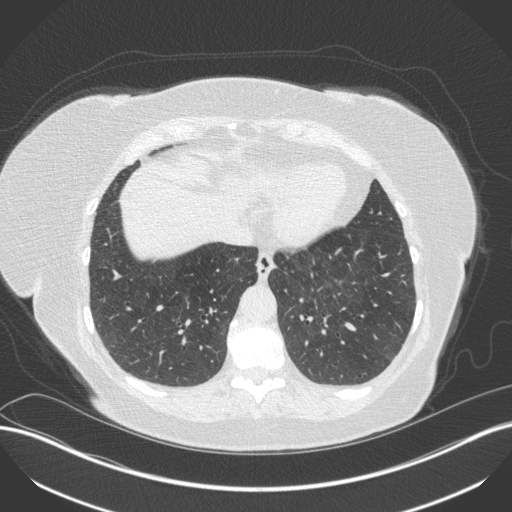
[im 154/338  lung]
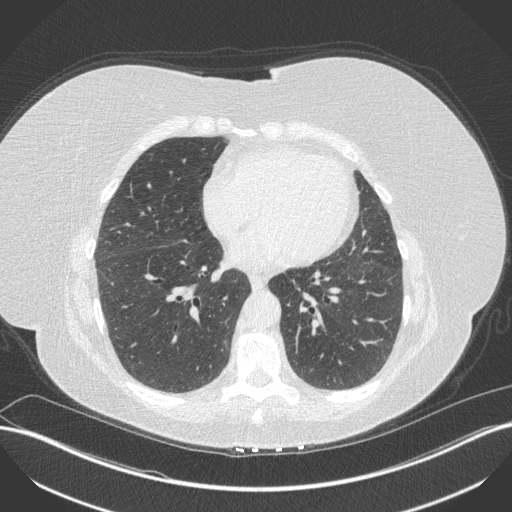
[im 169/338  lung]
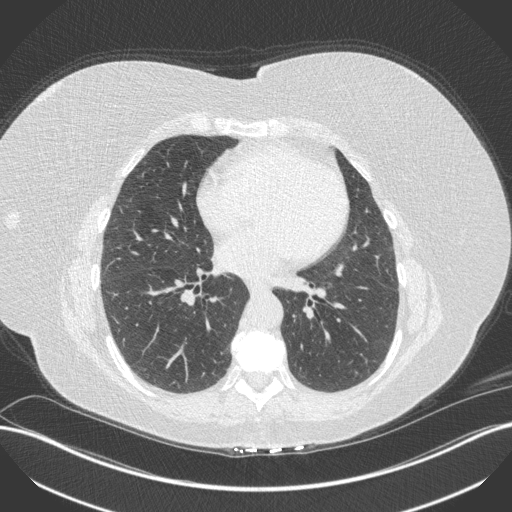
[im 184/338  mediastinal]
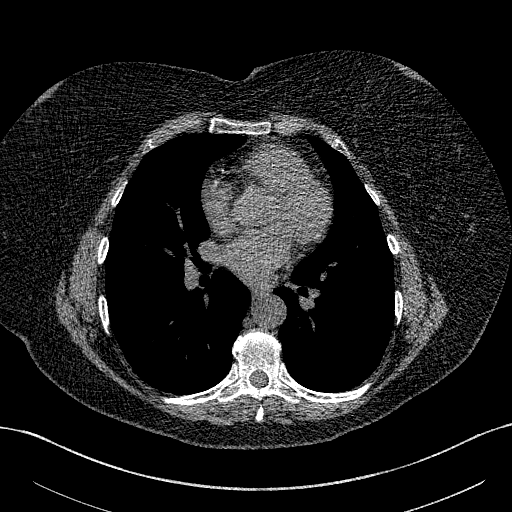
[im 184/338  lung]
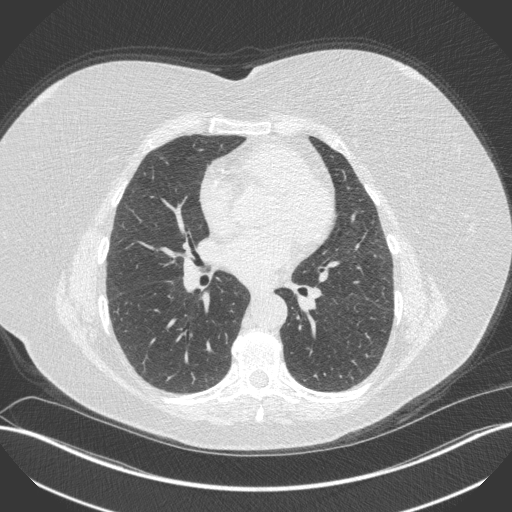
[im 215/338  lung]
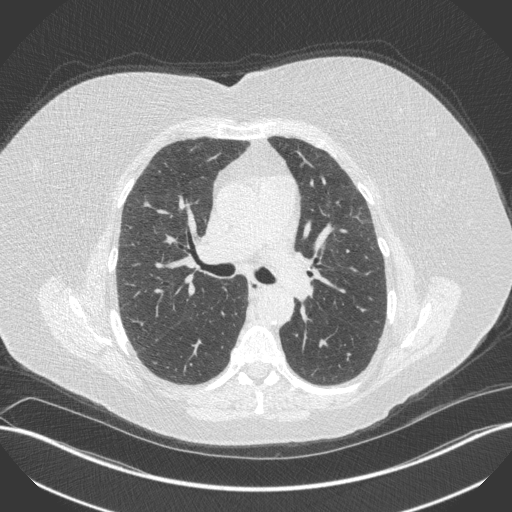
[im 230/338  lung]
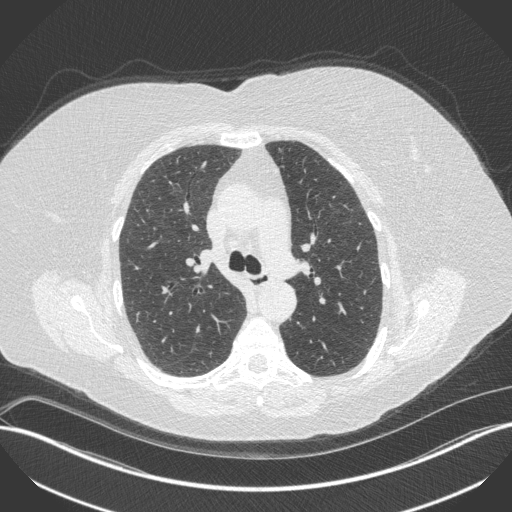
[im 253/338  lung]
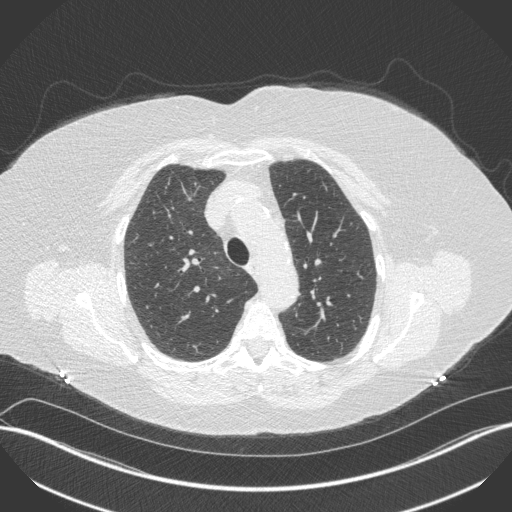
[im 276/338  mediastinal]
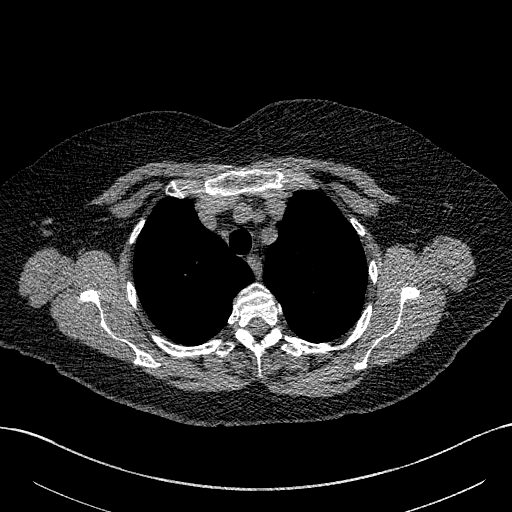
[im 276/338  lung]
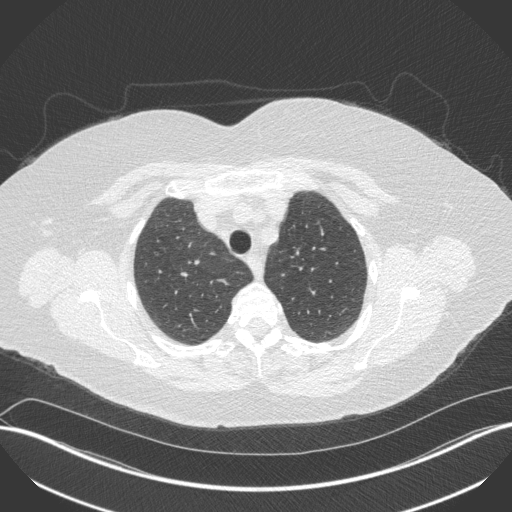
[im 292/338  lung]
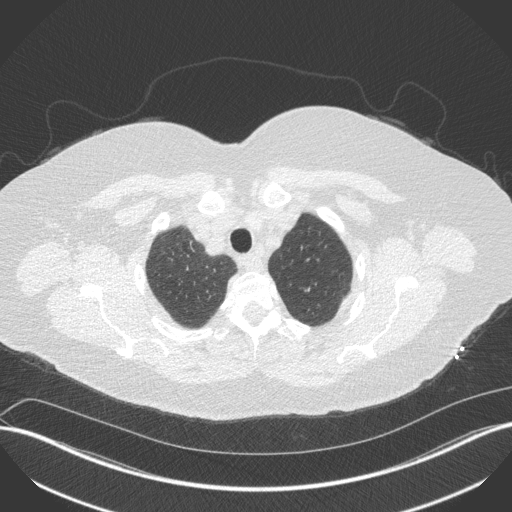
[im 322/338  lung]
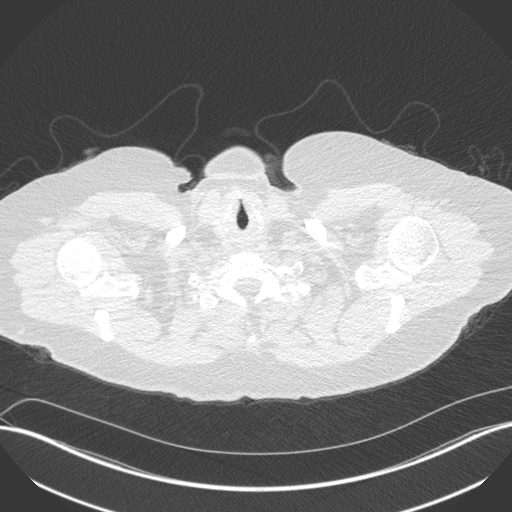

[15 of 32 positions shown; findings below may reference images not displayed]

FINDINGS: Cardiovascular: Normal heart size. No significant pericardial
effusion/thickening. Three-vessel coronary atherosclerosis.
Atherosclerotic nonaneurysmal thoracic aorta. Normal caliber
pulmonary arteries.

Mediastinum/Nodes: No discrete thyroid nodules. Unremarkable
esophagus. No pathologically enlarged axillary, mediastinal or hilar
lymph nodes, noting limited sensitivity for the detection of hilar
adenopathy on this noncontrast study.

Lungs/Pleura: No pneumothorax. No pleural effusion. Mild
centrilobular emphysema with diffuse bronchial wall thickening. No
acute consolidative airspace disease or lung masses. No significant
pulmonary nodules.

Upper abdomen: Diffuse hepatic steatosis. Coarse posterior right
liver calcification. Cholelithiasis. Partially visualized 3.8 cm
lateral upper right renal simple cyst.

Musculoskeletal: No aggressive appearing focal osseous lesions.
Moderate thoracic spondylosis.
IMPRESSION: 1. Lung-RADS 1, negative. Continue annual screening with low-dose
chest CT without contrast in 12 months.
2. Three-vessel coronary atherosclerosis.
3. Diffuse hepatic steatosis.
4. Cholelithiasis.
5. Aortic Atherosclerosis (3B2C0-Y2O.O) and Emphysema (3B2C0-M18.F).

## 2022-04-04 ENCOUNTER — Other Ambulatory Visit: Payer: Self-pay

## 2022-04-04 DIAGNOSIS — Z87891 Personal history of nicotine dependence: Secondary | ICD-10-CM

## 2022-04-04 DIAGNOSIS — Z122 Encounter for screening for malignant neoplasm of respiratory organs: Secondary | ICD-10-CM

## 2022-05-12 ENCOUNTER — Other Ambulatory Visit: Payer: Self-pay | Admitting: Family Medicine

## 2022-06-17 DIAGNOSIS — H2000 Unspecified acute and subacute iridocyclitis: Secondary | ICD-10-CM | POA: Diagnosis not present

## 2022-06-30 ENCOUNTER — Ambulatory Visit
Admission: RE | Admit: 2022-06-30 | Discharge: 2022-06-30 | Disposition: A | Discharge status: Home / Self Care | Payer: PPO | Source: Ambulatory Visit | Attending: Family Medicine | Admitting: Family Medicine

## 2022-06-30 DIAGNOSIS — Z1231 Encounter for screening mammogram for malignant neoplasm of breast: Secondary | ICD-10-CM | POA: Diagnosis not present

## 2022-09-18 ENCOUNTER — Ambulatory Visit (INDEPENDENT_AMBULATORY_CARE_PROVIDER_SITE_OTHER): Payer: PPO | Admitting: Family Medicine

## 2022-09-18 ENCOUNTER — Encounter: Payer: Self-pay | Admitting: Family Medicine

## 2022-09-18 VITALS — BP 137/83 | HR 91 | Temp 97.6°F | Resp 12 | Ht 62.5 in | Wt 191.5 lb

## 2022-09-18 DIAGNOSIS — E782 Mixed hyperlipidemia: Secondary | ICD-10-CM | POA: Diagnosis not present

## 2022-09-18 DIAGNOSIS — I10 Essential (primary) hypertension: Secondary | ICD-10-CM

## 2022-09-18 DIAGNOSIS — R7303 Prediabetes: Secondary | ICD-10-CM | POA: Diagnosis not present

## 2022-09-18 DIAGNOSIS — D582 Other hemoglobinopathies: Secondary | ICD-10-CM

## 2022-09-18 NOTE — Progress Notes (Signed)
Established Patient Office Visit  Subjective   Patient ID: Kim Ritter, female    DOB: 12-23-1955  Age: 67 y.o. MRN: [id number]  Chief Complaint  Patient presents with   Medical Management of Chronic Issues    Geraldine Contras is here today for medical management of her chronic conditions.  Overall she is doing well and has no major questions or concerns.  She denies symptoms of too high or too low of a BP. She occasionally takes her BP at home and it has been normal. She was under more stress than normal in the past couple of months due to problems with her cataract surgery.  She is frustrated with her weight gain.    Patient Active Problem List   Diagnosis Date Noted   Aortic atherosclerosis (HCC) 06/14/2020   Pulmonary emphysema (HCC) 06/14/2020   Prediabetes 10/01/2017   History of tobacco use disorder 09/29/2016   Obesity 09/15/2016   Essential hypertension 09/15/2016   Hyperlipidemia 05/27/2014   Elevated hemoglobin (HCC) 04/29/2014   Other seasonal allergic rhinitis 04/29/2014      Review of Systems  Eyes:  Negative for blurred vision.  Neurological:  Negative for dizziness and headaches.      Objective:     BP 137/83 (BP Location: Right Arm, Patient Position: Sitting, Cuff Size: Large)   Pulse 91   Temp 97.6 F (36.4 C) (Temporal)   Resp 12   Ht 5' 2.5" (1.588 m)   Wt 191 lb 8 oz (86.9 kg)   SpO2 98%   BMI 34.47 kg/m  BP Readings from Last 3 Encounters:  09/18/22 137/83  03/20/22 138/84  02/14/22 137/82   Wt Readings from Last 3 Encounters:  09/18/22 191 lb 8 oz (86.9 kg)  03/20/22 188 lb 9.6 oz (85.5 kg)  02/14/22 188 lb 6.4 oz (85.5 kg)      Physical Exam Constitutional:      Appearance: Normal appearance.  Cardiovascular:     Rate and Rhythm: Normal rate and regular rhythm.     Heart sounds: Normal heart sounds.  Pulmonary:     Effort: Pulmonary effort is normal.     Breath sounds: Normal breath sounds.  Musculoskeletal:        General: No  swelling.  Neurological:     General: No focal deficit present.     Mental Status: She is alert and oriented to person, place, and time.    No results found for any visits on 09/18/22.  Last metabolic panel Lab Results  Component Value Date   GLUCOSE 105 (H) 03/20/2022   NA 140 03/20/2022   K 4.1 03/20/2022   CL 100 03/20/2022   CO2 25 03/20/2022   BUN 12 03/20/2022   CREATININE 0.75 03/20/2022   EGFR 87 03/20/2022   CALCIUM 9.6 03/20/2022   PROT 7.4 03/20/2022   ALBUMIN 4.5 03/20/2022   LABGLOB 2.9 03/20/2022   AGRATIO 1.6 03/20/2022   BILITOT 0.4 03/20/2022   ALKPHOS 114 03/20/2022   AST 51 (H) 03/20/2022   ALT 75 (H) 03/20/2022   Last lipids Lab Results  Component Value Date   CHOL 156 03/20/2022   HDL 39 (L) 03/20/2022   LDLCALC 87 03/20/2022   TRIG 175 (H) 03/20/2022   CHOLHDL 4.0 03/20/2022   Last hemoglobin A1c Lab Results  Component Value Date   HGBA1C 6.1 (H) 03/20/2022      The 10-year ASCVD risk score (Arnett DK, et al., 2019) is: 17.9%    Assessment &  Plan:   Problem List Items Addressed This Visit       Cardiovascular and Mediastinum   Essential hypertension - Primary    Well controlled  Continue current meds  Recheck CMP  F/u 6 months        Relevant Orders   Comprehensive metabolic panel     Other   Hyperlipidemia    Relatively well controlled Previous lipid panel had low HDL and elevated triglycerides  Continue statin Recheck lipid panel       Relevant Orders   Comprehensive metabolic panel   Lipid Panel With LDL/HDL Ratio   Elevated hemoglobin (HCC)    Stable  Last Hbg was 16.1 Recheck CBC      Relevant Orders   CBC w/Diff/Platelet   Prediabetes    Encourage lifestyle modifications  Last HbgA1c was 6.6 Recheck HbgA1c      Relevant Orders   Hemoglobin A1c    Return in about 6 months (around 03/21/2023) for CPE, AWV.    Rometta Emery, Medical Student  Patient seen along with MS3 student Jodi Marble.  I personally evaluated this patient along with the student, and verified all aspects of the history, physical exam, and medical decision making as documented by the student. I agree with the student's documentation and have made all necessary edits.  Lanis Storlie, Marzella Schlein, MD, MPH [facility]

## 2022-09-18 NOTE — Assessment & Plan Note (Signed)
Encourage lifestyle modifications  Last HbgA1c was 6.6 Recheck HbgA1c

## 2022-09-18 NOTE — Assessment & Plan Note (Addendum)
Well controlled  Continue current meds  Recheck CMP  F/u 6 months

## 2022-09-18 NOTE — Assessment & Plan Note (Signed)
Stable  Last Hbg was 16.1 Recheck CBC

## 2022-09-18 NOTE — Assessment & Plan Note (Signed)
Relatively well controlled Previous lipid panel had low HDL and elevated triglycerides  Continue statin Recheck lipid panel

## 2022-09-18 NOTE — Progress Notes (Deleted)
   Established Patient Office Visit  Subjective   Patient ID: Kim Ritter, female    DOB: 1955-12-12  Age: 67 y.o. MRN: [id number]  No chief complaint on file.   HPI  Discussed the use of AI scribe software for clinical note transcription with the patient, who gave verbal consent to proceed.  History of Present Illness           {History (Optional):23778}  ROS    Objective:     There were no vitals taken for this visit. {Vitals History (Optional):23777}  Physical Exam   No results found for any visits on 09/18/22.  {Labs (Optional):23779}  The 10-year ASCVD risk score (Arnett DK, et al., 2019) is: 18.1%    Assessment & Plan:   Problem List Items Addressed This Visit       Cardiovascular and Mediastinum   Essential hypertension - Primary     Other   Hyperlipidemia   Prediabetes                 No follow-ups on file.    Shirlee Latch, MD

## 2022-11-10 ENCOUNTER — Other Ambulatory Visit: Payer: Self-pay | Admitting: Family Medicine

## 2022-11-10 NOTE — Telephone Encounter (Signed)
Labs in date  Requested Prescriptions  Pending Prescriptions Disp Refills   hydrochlorothiazide (HYDRODIURIL) 12.5 MG tablet [Pharmacy Med Name: HYDROCHLOROTHIAZIDE 12.5 MG TB] 90 tablet 1    Sig: TAKE 1 TABLET BY MOUTH EVERY DAY     Cardiovascular: Diuretics - Thiazide Failed - 11/10/2022  1:35 AM      Failed - Na in normal range and within 180 days    Sodium  Date Value Ref Range Status  09/18/2022 146 (H) 134 - 144 mmol/L Final         Passed - Cr in normal range and within 180 days    Creatinine, Ser  Date Value Ref Range Status  09/18/2022 0.86 0.57 - 1.00 mg/dL Final         Passed - K in normal range and within 180 days    Potassium  Date Value Ref Range Status  09/18/2022 4.5 3.5 - 5.2 mmol/L Final         Passed - Last BP in normal range    BP Readings from Last 1 Encounters:  09/18/22 137/83         Passed - Valid encounter within last 6 months    Recent Outpatient Visits           1 month ago Essential hypertension   [facility] [place], Marzella Schlein, MD   7 months ago Encounter for annual wellness visit (AWV) in Medicare patient   [facility], Marzella Schlein, MD   1 year ago Essential hypertension   [place] [facility] Merita Norton T, FNP   1 year ago Cystitis with hematuria   [place] [facility] [place], Marzella Schlein, MD   1 year ago Medicare annual wellness visit, subsequent   [facility] [place], Oswaldo Conroy, PA-C       Future Appointments             In 4 months Bacigalupo, Marzella Schlein, MD [facility]             rosuvastatin (CRESTOR) 10 MG tablet [Pharmacy Med Name: ROSUVASTATIN CALCIUM 10 MG TAB] 90 tablet 1    Sig: TAKE 1 TABLET BY MOUTH EVERY DAY     Cardiovascular:  Antilipid - Statins 2 Failed - 11/10/2022  1:35 AM      Failed - Lipid Panel in normal range within the last 12 months     Cholesterol, Total  Date Value Ref Range Status  09/18/2022 162 100 - 199 mg/dL Final   LDL Chol Calc (NIH)  Date Value Ref Range Status  09/18/2022 96 0 - 99 mg/dL Final   HDL  Date Value Ref Range Status  09/18/2022 37 (L) >39 mg/dL Final   Triglycerides  Date Value Ref Range Status  09/18/2022 163 (H) 0 - 149 mg/dL Final         Passed - Cr in normal range and within 360 days    Creatinine, Ser  Date Value Ref Range Status  09/18/2022 0.86 0.57 - 1.00 mg/dL Final         Passed - Patient is not pregnant      Passed - Valid encounter within last 12 months    Recent Outpatient Visits           1 month ago Essential hypertension    [facility] [place], Marzella Schlein, MD   7 months ago Encounter for  annual wellness visit (AWV) in Medicare patient   [facility] [place], Marzella Schlein, MD   1 year ago Essential hypertension   [place] [facility] Merita Norton T, FNP   1 year ago Cystitis with hematuria   [place] [facility] [place], Marzella Schlein, MD   1 year ago Medicare annual wellness visit, subsequent   [place] [facility] [facility], Oswaldo Conroy, PA-C       Future Appointments             In 4 months Bacigalupo, Marzella Schlein, MD [facility], [facility]             amLODipine (NORVASC) 10 MG tablet [Pharmacy Med Name: AMLODIPINE BESYLATE 10 MG TAB] 90 tablet 1    Sig: TAKE 1 TABLET BY MOUTH EVERY DAY     Cardiovascular: Calcium Channel Blockers 2 Passed - 11/10/2022  1:35 AM      Passed - Last BP in normal range    BP Readings from Last 1 Encounters:  09/18/22 137/83         Passed - Last Heart Rate in normal range    Pulse Readings from Last 1 Encounters:  09/18/22 91         Passed - Valid encounter within last 6 months    Recent Outpatient Visits           1 month ago Essential hypertension   [place] [facility]  [place], Marzella Schlein, MD   7 months ago Encounter for annual wellness visit (AWV) in Medicare patient   [facility] [place], Marzella Schlein, MD   1 year ago Essential hypertension   [place] [facility] Merita Norton T, FNP   1 year ago Cystitis with hematuria   [facility] [place], Marzella Schlein, MD   1 year ago Medicare annual wellness visit, subsequent   [place] [facility] [place], Oswaldo Conroy, PA-C       Future Appointments             In 4 months Bacigalupo, Marzella Schlein, MD [facility], [facility]

## 2022-11-17 DIAGNOSIS — H2511 Age-related nuclear cataract, right eye: Secondary | ICD-10-CM | POA: Diagnosis not present

## 2022-11-17 DIAGNOSIS — Z961 Presence of intraocular lens: Secondary | ICD-10-CM | POA: Diagnosis not present

## 2022-11-17 DIAGNOSIS — M3501 Sicca syndrome with keratoconjunctivitis: Secondary | ICD-10-CM | POA: Diagnosis not present

## 2022-12-25 DIAGNOSIS — D2272 Melanocytic nevi of left lower limb, including hip: Secondary | ICD-10-CM | POA: Diagnosis not present

## 2022-12-25 DIAGNOSIS — D2261 Melanocytic nevi of right upper limb, including shoulder: Secondary | ICD-10-CM | POA: Diagnosis not present

## 2022-12-25 DIAGNOSIS — D485 Neoplasm of uncertain behavior of skin: Secondary | ICD-10-CM | POA: Diagnosis not present

## 2022-12-25 DIAGNOSIS — D2262 Melanocytic nevi of left upper limb, including shoulder: Secondary | ICD-10-CM | POA: Diagnosis not present

## 2022-12-25 DIAGNOSIS — L570 Actinic keratosis: Secondary | ICD-10-CM | POA: Diagnosis not present

## 2022-12-25 DIAGNOSIS — X32XXXA Exposure to sunlight, initial encounter: Secondary | ICD-10-CM | POA: Diagnosis not present

## 2022-12-25 DIAGNOSIS — L820 Inflamed seborrheic keratosis: Secondary | ICD-10-CM | POA: Diagnosis not present

## 2022-12-25 DIAGNOSIS — R208 Other disturbances of skin sensation: Secondary | ICD-10-CM | POA: Diagnosis not present

## 2022-12-25 DIAGNOSIS — L720 Epidermal cyst: Secondary | ICD-10-CM | POA: Diagnosis not present

## 2022-12-25 DIAGNOSIS — D225 Melanocytic nevi of trunk: Secondary | ICD-10-CM | POA: Diagnosis not present

## 2022-12-25 DIAGNOSIS — L538 Other specified erythematous conditions: Secondary | ICD-10-CM | POA: Diagnosis not present

## 2023-02-04 ENCOUNTER — Other Ambulatory Visit: Payer: Self-pay | Admitting: Acute Care

## 2023-02-04 DIAGNOSIS — Z87891 Personal history of nicotine dependence: Secondary | ICD-10-CM

## 2023-02-04 DIAGNOSIS — Z122 Encounter for screening for malignant neoplasm of respiratory organs: Secondary | ICD-10-CM

## 2023-03-27 ENCOUNTER — Encounter: Payer: Self-pay | Admitting: Family Medicine

## 2023-03-27 ENCOUNTER — Ambulatory Visit: Payer: PPO | Admitting: Family Medicine

## 2023-03-27 ENCOUNTER — Ambulatory Visit (INDEPENDENT_AMBULATORY_CARE_PROVIDER_SITE_OTHER): Payer: PPO

## 2023-03-27 VITALS — BP 138/88 | Ht 62.5 in | Wt 188.3 lb

## 2023-03-27 VITALS — BP 130/71 | HR 65 | Ht 62.5 in | Wt 187.4 lb

## 2023-03-27 DIAGNOSIS — D582 Other hemoglobinopathies: Secondary | ICD-10-CM | POA: Diagnosis not present

## 2023-03-27 DIAGNOSIS — E66811 Obesity, class 1: Secondary | ICD-10-CM

## 2023-03-27 DIAGNOSIS — Z0001 Encounter for general adult medical examination with abnormal findings: Secondary | ICD-10-CM

## 2023-03-27 DIAGNOSIS — Z780 Asymptomatic menopausal state: Secondary | ICD-10-CM | POA: Diagnosis not present

## 2023-03-27 DIAGNOSIS — Z1231 Encounter for screening mammogram for malignant neoplasm of breast: Secondary | ICD-10-CM

## 2023-03-27 DIAGNOSIS — I700 Atherosclerosis of aorta: Secondary | ICD-10-CM

## 2023-03-27 DIAGNOSIS — R7303 Prediabetes: Secondary | ICD-10-CM

## 2023-03-27 DIAGNOSIS — I10 Essential (primary) hypertension: Secondary | ICD-10-CM

## 2023-03-27 DIAGNOSIS — G2581 Restless legs syndrome: Secondary | ICD-10-CM | POA: Diagnosis not present

## 2023-03-27 DIAGNOSIS — M8589 Other specified disorders of bone density and structure, multiple sites: Secondary | ICD-10-CM | POA: Diagnosis not present

## 2023-03-27 DIAGNOSIS — Z6833 Body mass index (BMI) 33.0-33.9, adult: Secondary | ICD-10-CM

## 2023-03-27 DIAGNOSIS — Z0000 Encounter for general adult medical examination without abnormal findings: Secondary | ICD-10-CM

## 2023-03-27 DIAGNOSIS — J439 Emphysema, unspecified: Secondary | ICD-10-CM | POA: Diagnosis not present

## 2023-03-27 DIAGNOSIS — E782 Mixed hyperlipidemia: Secondary | ICD-10-CM

## 2023-03-27 NOTE — Progress Notes (Signed)
Subjective:   Kim Ritter is a 68 y.o. female who presents for Medicare Annual (Subsequent) preventive examination.  Visit Complete: In person  Cardiac Risk Factors include: advanced age (>59men, >66 women);hypertension;dyslipidemia;obesity (BMI >30kg/m2)     Objective:    Today's Vitals   03/27/23 0956  BP: 138/88  Weight: 188 lb 4.8 oz (85.4 kg)  Height: 5' 2.5" (1.588 m)   Body mass index is 33.89 kg/m.     03/27/2023   10:04 AM 02/14/2022   11:13 AM 09/15/2016    2:10 PM  Advanced Directives  Does Patient Have a Medical Advance Directive? No No No  Would patient like information on creating a medical advance directive? No - Patient declined Yes (MAU/Ambulatory/Procedural Areas - Information given)     Current Medications (verified) Outpatient Encounter Medications as of 03/27/2023  Medication Sig   amLODipine (NORVASC) 10 MG tablet TAKE 1 TABLET BY MOUTH EVERY DAY   ascorbic acid (VITAMIN C) 500 MG tablet    Biotin 10 MG TABS    Cholecalciferol (VITAMIN D3) 50 MCG (2000 UT) TABS    hydrochlorothiazide (HYDRODIURIL) 12.5 MG tablet TAKE 1 TABLET BY MOUTH EVERY DAY   Multiple Vitamin (MULTIVITAMIN WITH MINERALS) TABS tablet Take 1 tablet by mouth daily.   rosuvastatin (CRESTOR) 10 MG tablet TAKE 1 TABLET BY MOUTH EVERY DAY   Zinc Sulfate 220 (50 Zn) MG TABS    No facility-administered encounter medications on file as of 03/27/2023.    Allergies (verified) Patient has no known allergies.   History: Past Medical History:  Diagnosis Date   Arthritis    Hypertension    Past Surgical History:  Procedure Laterality Date   ANTERIOR VITRECTOMY Left 02/14/2022   Procedure: ANTERIOR VITRECTOMY;  Surgeon: Kim Manila, MD;  Location: [facility];  Service: Ophthalmology;  Laterality: Left;   BREAST REDUCTION SURGERY Bilateral 1998   CATARACT EXTRACTION W/PHACO Left 02/14/2022   Procedure: CATARACT EXTRACTION PHACO AND INTRAOCULAR LENS PLACEMENT (IOC) LEFT;   Surgeon: Kim Manila, MD;  Location: [facility];  Service: Ophthalmology;  Laterality: Left;  6.42 0:50.9   COLONOSCOPY WITH PROPOFOL N/A 07/13/2014   Procedure: COLONOSCOPY WITH PROPOFOL;  Surgeon: Kim Maxwell, MD;  Location: [facility];  Service: Endoscopy;  Laterality: N/A;   HEMORROIDECTOMY     REDUCTION MAMMAPLASTY     Family History  Problem Relation Age of Onset   Congestive Heart Failure Mother    Kidney failure Mother    Melanoma Mother    Breast cancer Neg Hx    Social History   Socioeconomic History   Marital status: Married    Spouse name: Kim Ritter   Number of children: 2   Years of education: HS   Highest education level: Not on file  Occupational History    Employer: SELF EMPLOYED  Tobacco Use   Smoking status: Former    Current packs/day: 0.00    Average packs/day: 0.5 packs/day for 30.0 years (15.0 ttl pk-yrs)    Types: Cigarettes    Start date: 01/1988    Quit date: 01/2018    Years since quitting: 5.2   Smokeless tobacco: Never  Vaping Use   Vaping status: Never Used  Substance and Sexual Activity   Alcohol use: No   Drug use: No   Sexual activity: Yes    Birth control/protection: Post-menopausal  Other Topics Concern   Not on file  Social History Narrative   Not on file   Social Drivers of Health  Financial Resource Strain: Low Risk  (03/27/2023)   Overall Financial Resource Strain (CARDIA)    Difficulty of Paying Living Expenses: Not hard at all  Food Insecurity: No Food Insecurity (03/27/2023)   Hunger Vital Sign    Worried About Running Out of Food in the Last Year: Never true    Ran Out of Food in the Last Year: Never true  Transportation Needs: No Transportation Needs (03/27/2023)   PRAPARE - Administrator, Civil Service (Medical): No    Lack of Transportation (Non-Medical): No  Physical Activity: Insufficiently Active (03/27/2023)   Exercise Vital Sign    Days of Exercise per Week: 7 days     Minutes of Exercise per Session: 20 min  Stress: No Stress Concern Present (03/27/2023)   [employer] of Occupational Health - Occupational Stress Questionnaire    Feeling of Stress : Not at all  Social Connections: Moderately Integrated (03/27/2023)   Social Connection and Isolation Panel [NHANES]    Frequency of Communication with Friends and Family: More than three times a week    Frequency of Social Gatherings with Friends and Family: Once a week    Attends Religious Services: More than 4 times per year    Active Member of [employer] or Organizations: No    Attends Engineer, structural: Never    Marital Status: Married    Tobacco Counseling Counseling given: Not Answered   Clinical Intake:  Pre-visit preparation completed: Yes  Pain : No/denies pain     BMI - recorded: 33.89 Nutritional Status: BMI > 30  Obese Nutritional Risks: None Diabetes: No  How often do you need to have someone help you when you read instructions, pamphlets, or other written materials from your doctor or pharmacy?: 1 - Never  Interpreter Needed?: No  Information entered by :: Kim Bucker, LPN   Activities of Daily Living    03/27/2023   10:07 AM 09/18/2022    8:52 AM  In your present state of health, do you have any difficulty performing the following activities:  Hearing? 0 0  Vision? 1 0  Difficulty concentrating or making decisions? 0 0  Walking or climbing stairs? 0 0  Dressing or bathing? 0 0  Doing errands, shopping? 0 0  Preparing Food and eating ? N   Using the Toilet? N   In the past six months, have you accidently leaked urine? Y   Do you have problems with loss of bowel control? N   Managing your Medications? N   Managing your Finances? N   Housekeeping or managing your Housekeeping? N     Patient Care Team: Kim Downer, MD as PCP - General (Family Medicine) Kim Manila, MD as Referring Physician (Ophthalmology)  Indicate any recent Medical  Services you may have received from other than [facility] providers in the past year (date may be approximate).     Assessment:   This is a routine wellness examination for [name].  Hearing/Vision screen Hearing Screening - Comments:: NO AIDS Vision Screening - Comments:: WEARS GLASSES ALL THE TIME- [email or web address]   Goals Addressed             This Visit's Progress    DIET - EAT MORE FRUITS AND VEGETABLES         Depression Screen    03/27/2023   10:02 AM 09/18/2022    8:52 AM 03/20/2022    9:00 AM 04/18/2021    1:16 PM 03/18/2021    9:39 AM  12/20/2020    8:36 AM 06/14/2020    8:52 AM  PHQ 2/9 Scores  PHQ - 2 Score 0 0 0 0 0 0 0  PHQ- 9 Score 0  0 1  0 2    Fall Risk    03/27/2023   10:07 AM 09/18/2022    8:52 AM 03/20/2022    9:00 AM 03/19/2022   10:26 PM 04/18/2021    1:16 PM  Fall Risk   Falls in the past year? 0 0 0 0 0  Number falls in past yr: 0 0 0  0  Injury with Fall? 0 0 0 0 0  Risk for fall due to : No Fall Risks No Fall Risks No Fall Risks  No Fall Risks  Follow up Falls prevention discussed;Falls evaluation completed Falls evaluation completed Falls evaluation completed  Falls evaluation completed    MEDICARE RISK AT HOME: Medicare Risk at Home Any stairs in or around the home?: No If so, are there any without handrails?: No Home free of loose throw rugs in walkways, pet beds, electrical cords, etc?: Yes Adequate lighting in your home to reduce risk of falls?: Yes Life alert?: No Use of a cane, walker or w/c?: No Grab bars in the bathroom?: Yes Shower chair or bench in shower?: Yes Elevated toilet seat or a handicapped toilet?: Yes  TIMED UP AND GO:  Was the test performed?  Yes  Length of time to ambulate 10 feet: 4 sec Gait steady and fast without use of assistive device    Cognitive Function:        03/27/2023   10:11 AM 03/20/2022    9:00 AM 03/18/2021    9:39 AM  6CIT Screen  What Year? 0 points 0 points 0 points  What month? 0 points 0 points 0  points  What time? 0 points 0 points 0 points  Count back from 20 0 points 0 points 0 points  Months in reverse 0 points 0 points 0 points  Repeat phrase 0 points 0 points 0 points  Total Score 0 points 0 points 0 points    Immunizations Immunization History  Administered Date(s) Administered   Fluad Quad(high Dose 65+) 03/12/2020, 12/20/2020, 03/20/2022   Influenza, High Dose Seasonal PF 12/25/2022   Influenza,inj,Quad PF,6+ Mos 10/01/2017, 11/19/2018   Influenza-Unspecified 09/23/2016   Janssen (J&J) SARS-COV-2 Vaccination 07/02/2019   PNEUMOCOCCAL CONJUGATE-20 03/18/2021   Pneumococcal Polysaccharide-23 03/12/2020   Tdap 04/22/2019    TDAP status: Up to date  Flu Vaccine status: Up to date  Pneumococcal vaccine status: Up to date  Covid-19 vaccine status: Declined, Education has been provided regarding the importance of this vaccine but patient still declined. Advised may receive this vaccine at local pharmacy or Health Dept.or vaccine clinic. Aware to provide a copy of the vaccination record if obtained from local pharmacy or Health Dept. Verbalized acceptance and understanding.  Qualifies for Shingles Vaccine? Yes   Zostavax completed No   Shingrix Completed?: No.    Education has been provided regarding the importance of this vaccine. Patient has been advised to call insurance company to determine out of pocket expense if they have not yet received this vaccine. Advised may also receive vaccine at local pharmacy or Health Dept. Verbalized acceptance and understanding.  Screening Tests Health Maintenance  Topic Date Due   Zoster Vaccines- Shingrix (1 of 2) Never done   COVID-19 Vaccine (2 - Janssen risk series) 07/30/2019   DEXA SCAN  12/17/2022   Medicare  Annual Wellness (AWV)  03/26/2024   MAMMOGRAM  06/29/2024   Colonoscopy  07/12/2024   DTaP/Tdap/Td (2 - Td or Tdap) 04/21/2029   Pneumonia Vaccine 61+ Years old  Completed   INFLUENZA VACCINE  Completed   Hepatitis  C Screening  Completed   HPV VACCINES  Aged Out   Lung Cancer Screening  Discontinued    Health Maintenance  Health Maintenance Due  Topic Date Due   Zoster Vaccines- Shingrix (1 of 2) Never done   COVID-19 Vaccine (2 - Janssen risk series) 07/30/2019   DEXA SCAN  12/17/2022    Colorectal cancer screening: Type of screening: Colonoscopy. Completed 07/13/14. Repeat every 10 years  Mammogram status: Completed 06/30/22. Repeat every year  Bone Density status: Completed 12/16/20. Results reflect: Bone density results: OSTEOPENIA. Repeat every 5 years.  Lung Cancer Screening: (Low Dose CT Chest recommended if Age 78-80 years, 20 pack-year currently smoking OR have quit w/in 15years.) does qualify.   Lung Cancer Screening Referral: SCHEDULED FOR 04/05/23  Additional Screening:  Hepatitis C Screening: does qualify; Completed 09/25/16  Vision Screening: Recommended annual ophthalmology exams for early detection of glaucoma and other disorders of the eye. Is the patient up to date with their annual eye exam?  Yes  Who is the provider or what is the name of the office in which the patient attends annual eye exams? DR.PORFILIO If pt is not established with a provider, would they like to be referred to a provider to establish care? No .   Dental Screening: Recommended annual dental exams for proper oral hygiene   Community Resource Referral / Chronic Care Management: CRR required this visit?  No   CCM required this visit?  No     Plan:     I have personally reviewed and noted the following in the patient's chart:   Medical and social history Use of alcohol, tobacco or illicit drugs  Current medications and supplements including opioid prescriptions. Patient is not currently taking opioid prescriptions. Functional ability and status Nutritional status Physical activity Advanced directives List of other physicians Hospitalizations, surgeries, and ER visits in previous 12  months Vitals Screenings to include cognitive, depression, and falls Referrals and appointments  In addition, I have reviewed and discussed with patient certain preventive protocols, quality metrics, and best practice recommendations. A written personalized care plan for preventive services as well as general preventive health recommendations were provided to patient.     Hal Hope, LPN   04/29/4008   After Visit Summary: (In Person-Declined) Patient declined AVS at this time.  Nurse Notes: NONE

## 2023-03-27 NOTE — Assessment & Plan Note (Signed)
Reports experiencing restless legs. Unlikely related to rosuvastatin. Discussed non-pharmacological remedies like placing a bar of ivory soap at the foot of the bed. - Consider non-pharmacological remedies for restless legs.

## 2023-03-27 NOTE — Assessment & Plan Note (Signed)
No new symptoms. Current management includes lifestyle modifications. - Order A1c test to monitor blood glucose levels.

## 2023-03-27 NOTE — Assessment & Plan Note (Signed)
Previous bone density scan in 2022 showed osteopenia. Due for repeat scan. - Order bone density scan.

## 2023-03-27 NOTE — Progress Notes (Signed)
Complete physical exam   Patient: Kim Ritter   DOB: 1955-02-20   68 y.o. Female  MRN: [id number] Visit Date: 03/27/2023  Today's healthcare provider: Shirlee Latch, MD   Chief Complaint  Patient presents with   Annual Exam    AWV completed today   Subjective    Kim Ritter is a 68 y.o. female who presents today for a complete physical exam.    Discussed the use of AI scribe software for clinical note transcription with the patient, who gave verbal consent to proceed.  History of Present Illness   The patient, with a history of smoking and pre-diabetes, presents with concerns about weight gain. Despite efforts to maintain a balanced diet and limit caloric intake, the patient reports a persistent increase in weight. The patient describes a pattern of eating one balanced meal a day, typically in the evening, and fasting for the remainder of the day. The patient denies any significant food cravings and has transitioned from regular sodas to diet versions in an attempt to reduce caloric intake.  In addition to weight concerns, the patient also reports experiencing restless leg syndrome. The patient denies any association between the restless leg syndrome and current medication, rosuvastatin. The patient also mentions a history of smoking, but has been abstinent for several years.        Last depression screening scores    03/27/2023   10:02 AM 09/18/2022    8:52 AM 03/20/2022    9:00 AM  PHQ 2/9 Scores  PHQ - 2 Score 0 0 0  PHQ- 9 Score 0  0   Last fall risk screening    03/27/2023   10:07 AM  Fall Risk   Falls in the past year? 0  Number falls in past yr: 0  Injury with Fall? 0  Risk for fall due to : No Fall Risks  Follow up Falls prevention discussed;Falls evaluation completed        Medications: Outpatient Medications Prior to Visit  Medication Sig   amLODipine (NORVASC) 10 MG tablet TAKE 1 TABLET BY MOUTH EVERY DAY   ascorbic acid (VITAMIN  C) 500 MG tablet    Biotin 10 MG TABS    Cholecalciferol (VITAMIN D3) 50 MCG (2000 UT) TABS    hydrochlorothiazide (HYDRODIURIL) 12.5 MG tablet TAKE 1 TABLET BY MOUTH EVERY DAY   Multiple Vitamin (MULTIVITAMIN WITH MINERALS) TABS tablet Take 1 tablet by mouth daily.   rosuvastatin (CRESTOR) 10 MG tablet TAKE 1 TABLET BY MOUTH EVERY DAY   Zinc Sulfate 220 (50 Zn) MG TABS    No facility-administered medications prior to visit.    Review of Systems    Objective    BP 130/71 (BP Location: Left Arm, Patient Position: Sitting, Cuff Size: Normal)   Pulse 65   Ht 5' 2.5" (1.588 m)   Wt 187 lb 6.4 oz (85 kg)   SpO2 99%   BMI 33.73 kg/m    Physical Exam Vitals reviewed.  Constitutional:      General: She is not in acute distress.    Appearance: Normal appearance. She is well-developed. She is not diaphoretic.  HENT:     Head: Normocephalic and atraumatic.     Right Ear: Tympanic membrane, ear canal and external ear normal.     Left Ear: Tympanic membrane, ear canal and external ear normal.     Nose: Nose normal.     Mouth/Throat:     Mouth: Mucous membranes are moist.  Pharynx: Oropharynx is clear. No oropharyngeal exudate.  Eyes:     General: No scleral icterus.    Conjunctiva/sclera: Conjunctivae normal.     Pupils: Pupils are equal, round, and reactive to light.  Neck:     Thyroid: No thyromegaly.  Cardiovascular:     Rate and Rhythm: Normal rate and regular rhythm.     Heart sounds: Normal heart sounds. No murmur heard. Pulmonary:     Effort: Pulmonary effort is normal. No respiratory distress.     Breath sounds: Normal breath sounds. No wheezing or rales.  Abdominal:     General: There is no distension.     Palpations: Abdomen is soft.     Tenderness: There is no abdominal tenderness.  Musculoskeletal:        General: No deformity.     Cervical back: Neck supple.     Right lower leg: No edema.     Left lower leg: No edema.  Lymphadenopathy:     Cervical: No  cervical adenopathy.  Skin:    General: Skin is warm and dry.     Findings: No rash.  Neurological:     Mental Status: She is alert and oriented to person, place, and time. Mental status is at baseline.     Gait: Gait normal.  Psychiatric:        Mood and Affect: Mood normal.        Behavior: Behavior normal.        Thought Content: Thought content normal.      No results found for any visits on 03/27/23.  Assessment & Plan    Routine Health Maintenance and Physical Exam  Exercise Activities and Dietary recommendations  Goals      DIET - EAT MORE FRUITS AND VEGETABLES        Immunization History  Administered Date(s) Administered   Fluad Quad(high Dose 65+) 03/12/2020, 12/20/2020, 03/20/2022   Influenza, High Dose Seasonal PF 12/25/2022   Influenza,inj,Quad PF,6+ Mos 10/01/2017, 11/19/2018   Influenza-Unspecified 09/23/2016   Janssen (J&J) SARS-COV-2 Vaccination 07/02/2019   PNEUMOCOCCAL CONJUGATE-20 03/18/2021   Pneumococcal Polysaccharide-23 03/12/2020   Tdap 04/22/2019    Health Maintenance  Topic Date Due   Zoster Vaccines- Shingrix (1 of 2) Never done   COVID-19 Vaccine (2 - Janssen risk series) 07/30/2019   DEXA SCAN  12/17/2022   Medicare Annual Wellness (AWV)  03/26/2024   MAMMOGRAM  06/29/2024   Colonoscopy  07/12/2024   DTaP/Tdap/Td (2 - Td or Tdap) 04/21/2029   Pneumonia Vaccine 40+ Years old  Completed   INFLUENZA VACCINE  Completed   Hepatitis C Screening  Completed   HPV VACCINES  Aged Out   Lung Cancer Screening  Discontinued    Discussed health benefits of physical activity, and encouraged her to engage in regular exercise appropriate for her age and condition.  Problem List Items Addressed This Visit       Cardiovascular and Mediastinum   Essential hypertension   Well-controlled with current medications (hydrochlorothiazide 12.5 mg daily, and amlodipine). - Continue current medications: hydrochlorothiazide 12.5 mg daily, and  amlodipine. - Follow up in 6 months for blood pressure check.      Relevant Orders   Comprehensive metabolic panel   Aortic atherosclerosis (HCC)   Continue statin        Respiratory   Pulmonary emphysema (HCC)   Chronic and stable Upcoming lung cancer screen        Musculoskeletal and Integument   Osteopenia of multiple sites  Previous bone density scan in 2022 showed osteopenia. Due for repeat scan. - Order bone density scan.      Relevant Orders   DG Bone Density     Other   Obesity   Reports difficulty with weight management despite dietary restrictions. Discussed hormonal, genetic, and metabolic factors, especially postmenopausal. Considered GLP-1 medications but noted poor insurance coverage unless diabetic. Contrave deemed unnecessary due to lack of cravings. Emphasized benefits of weightlifting and intermittent fasting with an 8-hour eating window and 16-hour fasting period. - Recommend weightlifting exercises to build muscle mass and improve metabolism. - Suggest intermittent fasting with an 8-hour eating window and 16-hour fasting period.      Hyperlipidemia   Previously well controlled Continue statin Repeat FLP and CMP      Relevant Orders   Comprehensive metabolic panel   Lipid panel   Elevated hemoglobin (HCC)   Likely due to smoking history Recheck CBC      Relevant Orders   CBC w/Diff/Platelet   Prediabetes   No new symptoms. Current management includes lifestyle modifications. - Order A1c test to monitor blood glucose levels.      Relevant Orders   Hemoglobin A1c   RLS (restless legs syndrome)   Reports experiencing restless legs. Unlikely related to rosuvastatin. Discussed non-pharmacological remedies like placing a bar of ivory soap at the foot of the bed. - Consider non-pharmacological remedies for restless legs.      Other Visit Diagnoses       Encounter for annual physical exam    -  Primary   Relevant Orders   CBC  w/Diff/Platelet   Hemoglobin A1c   Comprehensive metabolic panel   Lipid panel     Breast cancer screening by mammogram       Relevant Orders   MM 3D SCREENING MAMMOGRAM BILATERAL BREAST     Postmenopausal       Relevant Orders   DG Bone Density         General Health Maintenance Up to date on pneumonia and flu shots. Tetanus current until 2031. Discussed shingles and RSV vaccines. Prefers one-time vaccinations over recurring ones. - Order mammogram and coordinate with bone density scan. - Recommend annual lung cancer screening; next due on April 05, 2023. - Schedule colonoscopy for 2026. - Discuss shingles vaccine; patient undecided. - Recommend RSV vaccine; patient to get it at the pharmacy.  Follow-up - Order routine labs: cholesterol, kidney and liver function, blood counts. - Follow up in 6 months for blood pressure check. - Notify patient of lab results via MyChart.          Return in about 6 months (around 09/24/2023) for chronic disease f/u.     Shirlee Latch, MD  [facility] [phone] (phone) [phone] (fax)  [facility]

## 2023-03-27 NOTE — Assessment & Plan Note (Signed)
Well-controlled with current medications (hydrochlorothiazide 12.5 mg daily, and amlodipine). - Continue current medications: hydrochlorothiazide 12.5 mg daily, and amlodipine. - Follow up in 6 months for blood pressure check.

## 2023-03-27 NOTE — Assessment & Plan Note (Signed)
Chronic and stable Upcoming lung cancer screen

## 2023-03-27 NOTE — Assessment & Plan Note (Signed)
 Previously well controlled Continue statin Repeat FLP and CMP

## 2023-03-27 NOTE — Assessment & Plan Note (Signed)
 Continue statin.

## 2023-03-27 NOTE — Assessment & Plan Note (Signed)
Likely due to smoking history Recheck CBC

## 2023-03-27 NOTE — Assessment & Plan Note (Signed)
Reports difficulty with weight management despite dietary restrictions. Discussed hormonal, genetic, and metabolic factors, especially postmenopausal. Considered GLP-1 medications but noted poor insurance coverage unless diabetic. Contrave deemed unnecessary due to lack of cravings. Emphasized benefits of weightlifting and intermittent fasting with an 8-hour eating window and 16-hour fasting period. - Recommend weightlifting exercises to build muscle mass and improve metabolism. - Suggest intermittent fasting with an 8-hour eating window and 16-hour fasting period.

## 2023-03-27 NOTE — Patient Instructions (Addendum)
Ms. Mccalla , Thank you for taking time to come for your Medicare Wellness Visit. I appreciate your ongoing commitment to your health goals. Please review the following plan we discussed and let me know if I can assist you in the future.   Referrals/Orders/Follow-Ups/Clinician Recommendations: NONE  This is a list of the screening recommended for you and due dates:  Health Maintenance  Topic Date Due   Zoster (Shingles) Vaccine (1 of 2) Never done   COVID-19 Vaccine (2 - Janssen risk series) 07/30/2019   DEXA scan (bone density measurement)  12/17/2022   Medicare Annual Wellness Visit  03/26/2024   Mammogram  06/29/2024   Colon Cancer Screening  07/12/2024   DTaP/Tdap/Td vaccine (2 - Td or Tdap) 04/21/2029   Pneumonia Vaccine  Completed   Flu Shot  Completed   Hepatitis C Screening  Completed   HPV Vaccine  Aged Out   Screening for Lung Cancer  Discontinued    Advanced directives: (ACP Link)Information on Advanced Care Planning can be found at [facility] Advance Health Care Directives Advance Health Care Directives ([email or web address]   Next Medicare Annual Wellness Visit scheduled for next year: Yes   04/01/24 @ 9:30 AM BY VIDEO

## 2023-03-28 LAB — CBC WITH DIFFERENTIAL/PLATELET
Basophils Absolute: 0 10*3/uL (ref 0.0–0.2)
Basos: 1 %
EOS (ABSOLUTE): 0.1 10*3/uL (ref 0.0–0.4)
Eos: 2 %
Hematocrit: 49.1 % — ABNORMAL HIGH (ref 34.0–46.6)
Hemoglobin: 16 g/dL — ABNORMAL HIGH (ref 11.1–15.9)
Immature Grans (Abs): 0 10*3/uL (ref 0.0–0.1)
Immature Granulocytes: 0 %
Lymphocytes Absolute: 3.1 10*3/uL (ref 0.7–3.1)
Lymphs: 36 %
MCH: 27.5 pg (ref 26.6–33.0)
MCHC: 32.6 g/dL (ref 31.5–35.7)
MCV: 85 fL (ref 79–97)
Monocytes Absolute: 0.5 10*3/uL (ref 0.1–0.9)
Monocytes: 6 %
Neutrophils Absolute: 4.8 10*3/uL (ref 1.4–7.0)
Neutrophils: 55 %
Platelets: 213 10*3/uL (ref 150–450)
RBC: 5.81 x10E6/uL — ABNORMAL HIGH (ref 3.77–5.28)
RDW: 13.4 % (ref 11.7–15.4)
WBC: 8.7 10*3/uL (ref 3.4–10.8)

## 2023-03-28 LAB — COMPREHENSIVE METABOLIC PANEL
ALT: 36 [IU]/L — ABNORMAL HIGH (ref 0–32)
AST: 27 [IU]/L (ref 0–40)
Albumin: 4.2 g/dL (ref 3.9–4.9)
Alkaline Phosphatase: 114 [IU]/L (ref 44–121)
BUN/Creatinine Ratio: 14 (ref 12–28)
BUN: 12 mg/dL (ref 8–27)
Bilirubin Total: 0.5 mg/dL (ref 0.0–1.2)
CO2: 26 mmol/L (ref 20–29)
Calcium: 9.9 mg/dL (ref 8.7–10.3)
Chloride: 102 mmol/L (ref 96–106)
Creatinine, Ser: 0.87 mg/dL (ref 0.57–1.00)
Globulin, Total: 3.2 g/dL (ref 1.5–4.5)
Glucose: 85 mg/dL (ref 70–99)
Potassium: 4 mmol/L (ref 3.5–5.2)
Sodium: 143 mmol/L (ref 134–144)
Total Protein: 7.4 g/dL (ref 6.0–8.5)
eGFR: 73 mL/min/{1.73_m2} (ref >59)

## 2023-03-28 LAB — LIPID PANEL
Chol/HDL Ratio: 4.8 {ratio} — ABNORMAL HIGH (ref 0.0–4.4)
Cholesterol, Total: 169 mg/dL (ref 100–199)
HDL: 35 mg/dL — ABNORMAL LOW (ref >39)
LDL Chol Calc (NIH): 101 mg/dL — ABNORMAL HIGH (ref 0–99)
Triglycerides: 189 mg/dL — ABNORMAL HIGH (ref 0–149)
VLDL Cholesterol Cal: 33 mg/dL (ref 5–40)

## 2023-03-28 LAB — HEMOGLOBIN A1C
Est. average glucose Bld gHb Est-mCnc: 134 mg/dL
Hgb A1c MFr Bld: 6.3 % — ABNORMAL HIGH (ref 4.8–5.6)

## 2023-03-29 ENCOUNTER — Encounter: Payer: Self-pay | Admitting: Family Medicine

## 2023-04-05 ENCOUNTER — Ambulatory Visit
Admission: RE | Admit: 2023-04-05 | Discharge: 2023-04-05 | Disposition: A | Discharge status: Home / Self Care | Payer: PPO | Source: Ambulatory Visit | Attending: Family Medicine | Admitting: Family Medicine

## 2023-04-05 DIAGNOSIS — J439 Emphysema, unspecified: Secondary | ICD-10-CM | POA: Diagnosis not present

## 2023-04-05 DIAGNOSIS — I700 Atherosclerosis of aorta: Secondary | ICD-10-CM | POA: Diagnosis not present

## 2023-04-05 DIAGNOSIS — Z122 Encounter for screening for malignant neoplasm of respiratory organs: Secondary | ICD-10-CM | POA: Diagnosis not present

## 2023-04-05 DIAGNOSIS — Z87891 Personal history of nicotine dependence: Secondary | ICD-10-CM

## 2023-04-25 ENCOUNTER — Other Ambulatory Visit: Payer: Self-pay

## 2023-04-25 DIAGNOSIS — Z87891 Personal history of nicotine dependence: Secondary | ICD-10-CM

## 2023-04-25 DIAGNOSIS — Z122 Encounter for screening for malignant neoplasm of respiratory organs: Secondary | ICD-10-CM

## 2023-05-05 ENCOUNTER — Other Ambulatory Visit: Payer: Self-pay | Admitting: Family Medicine

## 2023-05-07 NOTE — Telephone Encounter (Signed)
 Requested Prescriptions  Pending Prescriptions Disp Refills   hydrochlorothiazide (HYDRODIURIL) 12.5 MG tablet [Pharmacy Med Name: HYDROCHLOROTHIAZIDE 12.5 MG TB] 90 tablet 1    Sig: TAKE 1 TABLET BY MOUTH EVERY DAY     Cardiovascular: Diuretics - Thiazide Passed - 05/07/2023  4:21 PM      Passed - Cr in normal range and within 180 days    Creatinine, Ser  Date Value Ref Range Status  03/27/2023 0.87 0.57 - 1.00 mg/dL Final         Passed - K in normal range and within 180 days    Potassium  Date Value Ref Range Status  03/27/2023 4.0 3.5 - 5.2 mmol/L Final         Passed - Na in normal range and within 180 days    Sodium  Date Value Ref Range Status  03/27/2023 143 134 - 144 mmol/L Final         Passed - Last BP in normal range    BP Readings from Last 1 Encounters:  03/27/23 130/71         Passed - Valid encounter within last 6 months    Recent Outpatient Visits           1 month ago Encounter for annual physical exam   [facility] [place], Marzella Schlein, MD               amLODipine (NORVASC) 10 MG tablet [Pharmacy Med Name: AMLODIPINE BESYLATE 10 MG TAB] 90 tablet 1    Sig: TAKE 1 TABLET BY MOUTH EVERY DAY     Cardiovascular: Calcium Channel Blockers 2 Passed - 05/07/2023  4:21 PM      Passed - Last BP in normal range    BP Readings from Last 1 Encounters:  03/27/23 130/71         Passed - Last Heart Rate in normal range    Pulse Readings from Last 1 Encounters:  03/27/23 65         Passed - Valid encounter within last 6 months    Recent Outpatient Visits           1 month ago Encounter for annual physical exam    [facility] [place], Marzella Schlein, MD               rosuvastatin (CRESTOR) 10 MG tablet [Pharmacy Med Name: ROSUVASTATIN CALCIUM 10 MG TAB] 90 tablet 1    Sig: TAKE 1 TABLET BY MOUTH EVERY DAY     Cardiovascular:  Antilipid - Statins 2 Failed - 05/07/2023  4:21 PM      Failed -  Lipid Panel in normal range within the last 12 months    Cholesterol, Total  Date Value Ref Range Status  03/27/2023 169 100 - 199 mg/dL Final   LDL Chol Calc (NIH)  Date Value Ref Range Status  03/27/2023 101 (H) 0 - 99 mg/dL Final   HDL  Date Value Ref Range Status  03/27/2023 35 (L) >39 mg/dL Final   Triglycerides  Date Value Ref Range Status  03/27/2023 189 (H) 0 - 149 mg/dL Final         Passed - Cr in normal range and within 360 days    Creatinine, Ser  Date Value Ref Range Status  03/27/2023 0.87 0.57 - 1.00 mg/dL Final         Passed - Patient is not pregnant      Passed - Valid  encounter within last 12 months    Recent Outpatient Visits           1 month ago Encounter for annual physical exam   [facility] [place], Marzella Schlein, MD

## 2023-05-18 DIAGNOSIS — Z961 Presence of intraocular lens: Secondary | ICD-10-CM | POA: Diagnosis not present

## 2023-05-18 DIAGNOSIS — H2511 Age-related nuclear cataract, right eye: Secondary | ICD-10-CM | POA: Diagnosis not present

## 2023-05-18 DIAGNOSIS — M3501 Sicca syndrome with keratoconjunctivitis: Secondary | ICD-10-CM | POA: Diagnosis not present

## 2023-05-18 DIAGNOSIS — H2000 Unspecified acute and subacute iridocyclitis: Secondary | ICD-10-CM | POA: Diagnosis not present

## 2023-05-30 ENCOUNTER — Encounter: Payer: Self-pay | Admitting: Ophthalmology

## 2023-05-30 DIAGNOSIS — H2511 Age-related nuclear cataract, right eye: Secondary | ICD-10-CM | POA: Diagnosis not present

## 2023-05-30 NOTE — Discharge Instructions (Signed)

## 2023-05-31 NOTE — Anesthesia Preprocedure Evaluation (Addendum)
 Anesthesia Evaluation  Patient identified by MRN, date of birth, ID band Patient awake    Reviewed: Allergy & Precautions, H&P , NPO status , Patient's Chart, lab work & pertinent test results  Airway Mallampati: II  TM Distance: >3 FB Neck ROM: Full    Dental no notable dental hx.    Pulmonary neg pulmonary ROS, COPD, former smoker   Pulmonary exam normal breath sounds clear to auscultation       Cardiovascular hypertension, negative cardio ROS Normal cardiovascular exam Rhythm:Regular Rate:Normal     Neuro/Psych negative neurological ROS  negative psych ROS   GI/Hepatic negative GI ROS, Neg liver ROS,,,  Endo/Other  negative endocrine ROS    Renal/GU negative Renal ROS  negative genitourinary   Musculoskeletal negative musculoskeletal ROS (+) Arthritis ,    Abdominal   Peds negative pediatric ROS (+)  Hematology negative hematology ROS (+)   Anesthesia Other Findings Previous cataract surgery 02-14-22 Dr. Welton Hall  HTN Wears dentures arthritis  Reproductive/Obstetrics negative OB ROS                             Anesthesia Physical Anesthesia Plan  ASA: 2  Anesthesia Plan: MAC   Post-op Pain Management:    Induction: Intravenous  PONV Risk Score and Plan:   Airway Management Planned: Natural Airway and Nasal Cannula  Additional Equipment:   Intra-op Plan:   Post-operative Plan:   Informed Consent: I have reviewed the patients History and Physical, chart, labs and discussed the procedure including the risks, benefits and alternatives for the proposed anesthesia with the patient or authorized representative who has indicated his/her understanding and acceptance.     Dental Advisory Given  Plan Discussed with: Anesthesiologist, CRNA and Surgeon  Anesthesia Plan Comments: (Patient consented for risks of anesthesia including but not limited to:  - adverse reactions to  medications - damage to eyes, teeth, lips or other oral mucosa - nerve damage due to positioning  - sore throat or hoarseness - Damage to heart, brain, nerves, lungs, other parts of body or loss of life  Patient voiced understanding and assent.)        Anesthesia Quick Evaluation

## 2023-06-05 ENCOUNTER — Ambulatory Visit: Admitting: Anesthesiology

## 2023-06-05 ENCOUNTER — Encounter
Admission: RE | Disposition: A | Discharge status: Home / Self Care | Payer: Self-pay | Source: Home / Self Care | Attending: Ophthalmology

## 2023-06-05 ENCOUNTER — Ambulatory Visit
Admission: RE | Admit: 2023-06-05 | Discharge: 2023-06-05 | Disposition: A | Discharge status: Home / Self Care | Attending: Ophthalmology | Admitting: Ophthalmology

## 2023-06-05 ENCOUNTER — Other Ambulatory Visit: Payer: Self-pay

## 2023-06-05 ENCOUNTER — Encounter: Payer: Self-pay | Admitting: Ophthalmology

## 2023-06-05 DIAGNOSIS — H2511 Age-related nuclear cataract, right eye: Secondary | ICD-10-CM | POA: Diagnosis not present

## 2023-06-05 DIAGNOSIS — J449 Chronic obstructive pulmonary disease, unspecified: Secondary | ICD-10-CM | POA: Diagnosis not present

## 2023-06-05 DIAGNOSIS — Z9842 Cataract extraction status, left eye: Secondary | ICD-10-CM | POA: Diagnosis not present

## 2023-06-05 DIAGNOSIS — Z961 Presence of intraocular lens: Secondary | ICD-10-CM | POA: Diagnosis not present

## 2023-06-05 DIAGNOSIS — I10 Essential (primary) hypertension: Secondary | ICD-10-CM | POA: Insufficient documentation

## 2023-06-05 DIAGNOSIS — Z87891 Personal history of nicotine dependence: Secondary | ICD-10-CM | POA: Insufficient documentation

## 2023-06-05 HISTORY — DX: Presence of dental prosthetic device (complete) (partial): Z97.2

## 2023-06-05 HISTORY — PX: CATARACT EXTRACTION W/PHACO: SHX586

## 2023-06-05 SURGERY — PHACOEMULSIFICATION, CATARACT, WITH IOL INSERTION
Anesthesia: Monitor Anesthesia Care | Site: Eye | Laterality: Right

## 2023-06-05 MED ORDER — MIDAZOLAM HCL 2 MG/2ML IJ SOLN
INTRAMUSCULAR | Status: DC | PRN
  Administered 2023-06-05: 2 mg via INTRAVENOUS

## 2023-06-05 MED ORDER — SODIUM CHLORIDE 0.9% FLUSH
INTRAVENOUS | Status: DC | PRN
  Administered 2023-06-05: 10 mL via INTRAVENOUS

## 2023-06-05 MED ORDER — LIDOCAINE HCL (PF) 2 % IJ SOLN
INTRAOCULAR | Status: DC | PRN
  Administered 2023-06-05: 2 mL

## 2023-06-05 MED ORDER — SIGHTPATH DOSE#1 BSS IO SOLN
INTRAOCULAR | Status: DC | PRN
  Administered 2023-06-05: 15 mL via INTRAOCULAR

## 2023-06-05 MED ORDER — SIGHTPATH DOSE#1 NA CHONDROIT SULF-NA HYALURON 40-17 MG/ML IO SOLN
INTRAOCULAR | Status: DC | PRN
  Administered 2023-06-05: 1 mL via INTRAOCULAR

## 2023-06-05 MED ORDER — FENTANYL CITRATE (PF) 100 MCG/2ML IJ SOLN
INTRAMUSCULAR | Status: DC | PRN
  Administered 2023-06-05: 25 ug via INTRAVENOUS

## 2023-06-05 MED ORDER — SIGHTPATH DOSE#1 BSS IO SOLN
INTRAOCULAR | Status: DC | PRN
  Administered 2023-06-05: 63 mL via OPHTHALMIC

## 2023-06-05 MED ORDER — FENTANYL CITRATE (PF) 100 MCG/2ML IJ SOLN
INTRAMUSCULAR | Status: AC
  Filled 2023-06-05: qty 2

## 2023-06-05 MED ORDER — ARMC OPHTHALMIC DILATING DROPS
1.0000 | OPHTHALMIC | Status: AC | PRN
  Administered 2023-06-05 (×3): 1 via OPHTHALMIC

## 2023-06-05 MED ORDER — TETRACAINE HCL 0.5 % OP SOLN
OPHTHALMIC | Status: AC
  Filled 2023-06-05: qty 4

## 2023-06-05 MED ORDER — ARMC OPHTHALMIC DILATING DROPS
OPHTHALMIC | Status: AC
  Filled 2023-06-05: qty 0.5

## 2023-06-05 MED ORDER — TETRACAINE HCL 0.5 % OP SOLN
1.0000 [drp] | OPHTHALMIC | Status: AC | PRN
  Administered 2023-06-05 (×3): 1 [drp] via OPHTHALMIC

## 2023-06-05 MED ORDER — MOXIFLOXACIN HCL 0.5 % OP SOLN
OPHTHALMIC | Status: DC | PRN
  Administered 2023-06-05: .2 mL via OPHTHALMIC

## 2023-06-05 MED ORDER — MIDAZOLAM HCL 2 MG/2ML IJ SOLN
INTRAMUSCULAR | Status: AC
  Filled 2023-06-05: qty 2

## 2023-06-05 MED ORDER — BRIMONIDINE TARTRATE-TIMOLOL 0.2-0.5 % OP SOLN
OPHTHALMIC | Status: DC | PRN
  Administered 2023-06-05: 1 [drp] via OPHTHALMIC

## 2023-06-05 SURGICAL SUPPLY — 12 items
CATARACT SUITE SIGHTPATH (MISCELLANEOUS) ×1 IMPLANT
CYSTOTOME ANGL RVRS SHRT 25G (CUTTER) ×1 IMPLANT
CYSTOTOME ANGL RVRS SHRT 25GA (CUTTER) ×1 IMPLANT
FEE CATARACT SUITE SIGHTPATH (MISCELLANEOUS) ×1 IMPLANT
GLOVE BIOGEL PI IND STRL 8 (GLOVE) ×1 IMPLANT
GLOVE SURG LX STRL 8.0 MICRO (GLOVE) ×1 IMPLANT
GLOVE SURG PROTEXIS BL SZ6.5 (GLOVE) ×1 IMPLANT
GLOVE SURG SYN 6.5 PF PI BL (GLOVE) ×1 IMPLANT
LENS IOL TECNIS EYHANCE 18.5 (Intraocular Lens) IMPLANT
NDL FILTER BLUNT 18X1 1/2 (NEEDLE) ×1 IMPLANT
NEEDLE FILTER BLUNT 18X1 1/2 (NEEDLE) ×1 IMPLANT
SYR 3ML LL SCALE MARK (SYRINGE) ×1 IMPLANT

## 2023-06-05 NOTE — Op Note (Signed)
 PREOPERATIVE DIAGNOSIS:  Nuclear sclerotic cataract of the right eye.   POSTOPERATIVE DIAGNOSIS:  Right Eye Cataract   OPERATIVE PROCEDURE:ORPROCALL@   SURGEON:  Kim Crews, MD.   ANESTHESIA:  Anesthesiologist: Emilie Harden, MD CRNA: Karan Osgood, CRNA  1.      Managed anesthesia care. 2.      0.13ml of Shugarcaine was instilled in the eye following the paracentesis.   COMPLICATIONS:  None.   TECHNIQUE:   Stop and chop   DESCRIPTION OF PROCEDURE:  The patient was examined and consented in the preoperative holding area where the aforementioned topical anesthesia was applied to the right eye and then brought back to the Operating Room where the right eye was prepped and draped in the usual sterile ophthalmic fashion and a lid speculum was placed. A paracentesis was created with the side port blade and the anterior chamber was filled with viscoelastic. A near clear corneal incision was performed with the steel keratome. A continuous curvilinear capsulorrhexis was performed with a cystotome followed by the capsulorrhexis forceps. Hydrodissection and hydrodelineation were carried out with BSS on a blunt cannula. The lens was removed in a stop and chop  technique and the remaining cortical material was removed with the irrigation-aspiration handpiece. The capsular bag was inflated with viscoelastic and the Technis [id number]  lens was placed in the capsular bag without complication. The remaining viscoelastic was removed from the eye with the irrigation-aspiration handpiece. The wounds were hydrated. The anterior chamber was flushed with BSS and the eye was inflated to physiologic pressure. 0.1ml of Vigamox  was placed in the anterior chamber. The wounds were found to be water tight. The eye was dressed with Combigan . The patient was given protective glasses to wear throughout the day and a shield with which to sleep tonight. The patient was also given drops with which to begin a drop regimen  today and will follow-up with me in one day. Implant Name Type Inv. Item Serial No. Manufacturer Lot No. LRB No. Used Action  LENS IOL TECNIS EYHANCE 18.5 - [id number] Intraocular Lens LENS IOL TECNIS EYHANCE 18.5 [id number] [employer]  Right 1 Implanted   Procedure(s): PHACOEMULSIFICATION, CATARACT, WITH IOL INSERTION 7.50 00:52.8 (Right)  Electronically signed: Clair Ritter 06/05/2023 11:35 AM

## 2023-06-05 NOTE — Transfer of Care (Signed)
 Immediate Anesthesia Transfer of Care Note  Patient: Kim Ritter  Procedure(s) Performed: PHACOEMULSIFICATION, CATARACT, WITH IOL INSERTION 7.50 00:52.8 (Right: Eye)  Patient Location: PACU  Anesthesia Type:MAC  Level of Consciousness: awake and alert   Airway & Oxygen Therapy: Patient Spontanous Breathing and Patient connected to nasal cannula oxygen  Post-op Assessment: Report given to RN  Post vital signs: Reviewed and stable  Last Vitals: Pt is not on 02 now  nl temp .    Vitals Value Taken Time  BP 110/70 06/05/23 1137  Temp    Pulse 70 06/05/23 1138  Resp 14 06/05/23 1138  SpO2 96 % 06/05/23 1138  Vitals shown include unfiled device data.  Last Pain:  Vitals:   06/05/23 1001  TempSrc: Temporal  PainSc: 0-No pain         Complications: No notable events documented.

## 2023-06-05 NOTE — H&P (Signed)
 [facility]   Primary Care Physician:  Mazie Speed, MD Ophthalmologist: Dr. Jeb Miner  Pre-Procedure History & Physical: HPI:  Kim Ritter is a 68 y.o. female here for cataract surgery.   Past Medical History:  Diagnosis Date   Arthritis    Hypertension    Wears dentures    full upper, partial lower    Past Surgical History:  Procedure Laterality Date   ANTERIOR VITRECTOMY Left 02/14/2022   Procedure: ANTERIOR VITRECTOMY;  Surgeon: Clair Crews, MD;  Location: [facility];  Service: Ophthalmology;  Laterality: Left;   BREAST REDUCTION SURGERY Bilateral 1998   CATARACT EXTRACTION W/PHACO Left 02/14/2022   Procedure: CATARACT EXTRACTION PHACO AND INTRAOCULAR LENS PLACEMENT (IOC) LEFT;  Surgeon: Clair Crews, MD;  Location: [facility];  Service: Ophthalmology;  Laterality: Left;  6.42 0:50.9   COLONOSCOPY WITH PROPOFOL  N/A 07/13/2014   Procedure: COLONOSCOPY WITH PROPOFOL ;  Surgeon: Luella Sager, MD;  Location: [facility];  Service: Endoscopy;  Laterality: N/A;   HEMORROIDECTOMY     REDUCTION MAMMAPLASTY      Prior to Admission medications   Medication Sig Start Date End Date Taking? Authorizing Provider  amLODipine  (NORVASC ) 10 MG tablet TAKE 1 TABLET BY MOUTH EVERY DAY 05/07/23  Yes Bacigalupo, Angela M, MD  ascorbic acid (VITAMIN C) 500 MG tablet    Yes [provider]  Biotin 10 MG TABS    Yes [provider]  CALCIUM  PO Take by mouth daily.   Yes [provider]  famotidine (PEPCID) 10 MG tablet Take 10 mg by mouth 2 (two) times daily as needed for heartburn or indigestion.   Yes [provider]  hydrochlorothiazide  (HYDRODIURIL ) 12.5 MG tablet TAKE 1 TABLET BY MOUTH EVERY DAY 05/07/23  Yes Bacigalupo, Stan Eans, MD  rosuvastatin  (CRESTOR ) 10 MG tablet TAKE 1 TABLET BY MOUTH EVERY DAY 05/07/23  Yes Bacigalupo, Stan Eans, MD  Zinc Sulfate 220 (50 Zn) MG TABS    Yes [provider]   Cholecalciferol (VITAMIN D3) 50 MCG (2000 UT) TABS     [provider]    Allergies as of 05/22/2023   (No Known Allergies)    Family History  Problem Relation Age of Onset   Congestive Heart Failure Mother    Kidney failure Mother    Melanoma Mother    Breast cancer Neg Hx     Social History   Socioeconomic History   Marital status: Married    Spouse name: Bambi Lever   Number of children: 2   Years of education: HS   Highest education level: Not on file  Occupational History    Employer: SELF EMPLOYED  Tobacco Use   Smoking status: Former    Current packs/day: 0.00    Average packs/day: 0.5 packs/day for 30.0 years (15.0 ttl pk-yrs)    Types: Cigarettes    Start date: 01/1988    Quit date: 01/2018    Years since quitting: 5.4   Smokeless tobacco: Never  Vaping Use   Vaping status: Never Used  Substance and Sexual Activity   Alcohol use: No   Drug use: No   Sexual activity: Yes    Birth control/protection: Post-menopausal  Other Topics Concern   Not on file  Social History Narrative   Not on file   Social Drivers of Health   Financial Resource Strain: Low Risk  (03/27/2023)   Overall Financial Resource Strain (CARDIA)    Difficulty of Paying Living Expenses: Not hard at  all  Food Insecurity: No Food Insecurity (03/27/2023)   Hunger Vital Sign    Worried About Running Out of Food in the Last Year: Never true    Ran Out of Food in the Last Year: Never true  Transportation Needs: No Transportation Needs (03/27/2023)   PRAPARE - Administrator, Civil Service (Medical): No    Lack of Transportation (Non-Medical): No  Physical Activity: Insufficiently Active (03/27/2023)   Exercise Vital Sign    Days of Exercise per Week: 7 days    Minutes of Exercise per Session: 20 min  Stress: No Stress Concern Present (03/27/2023)   [employer] of Occupational Health - Occupational Stress Questionnaire    Feeling of Stress : Not at all  Social  Connections: Moderately Integrated (03/27/2023)   Social Connection and Isolation Panel [NHANES]    Frequency of Communication with Friends and Family: More than three times a week    Frequency of Social Gatherings with Friends and Family: Once a week    Attends Religious Services: More than 4 times per year    Active Member of [employer] or Organizations: No    Attends Banker Meetings: Never    Marital Status: Married  Catering manager Violence: Not At Risk (03/27/2023)   Humiliation, Afraid, Rape, and Kick questionnaire    Fear of Current or Ex-Partner: No    Emotionally Abused: No    Physically Abused: No    Sexually Abused: No    Review of Systems: See HPI, otherwise negative ROS  Physical Exam: BP 132/86   Pulse 75   Temp 98.1 F (36.7 C) (Temporal)   Resp 20   Ht 5\' 2"  (1.575 m)   Wt 87.5 kg   SpO2 98%   BMI 35.30 kg/m  General:   Alert, cooperative. Head:  Normocephalic and atraumatic. Respiratory:  Normal work of breathing. Cardiovascular:  NAD  Impression/Plan: Kim Ritter is here for cataract surgery.  Risks, benefits, limitations, and alternatives regarding cataract surgery have been reviewed with the patient.  Questions have been answered.  All parties agreeable.   Clair Crews, MD  06/05/2023, 11:02 AM

## 2023-06-05 NOTE — Anesthesia Postprocedure Evaluation (Signed)
 Anesthesia Post Note  Patient: Kim Ritter  Procedure(s) Performed: PHACOEMULSIFICATION, CATARACT, WITH IOL INSERTION 7.50 00:52.8 (Right: Eye)  Patient location during evaluation: PACU Anesthesia Type: MAC Level of consciousness: awake and alert Pain management: pain level controlled Vital Signs Assessment: post-procedure vital signs reviewed and stable Respiratory status: spontaneous breathing, nonlabored ventilation, respiratory function stable and patient connected to nasal cannula oxygen Cardiovascular status: stable and blood pressure returned to baseline Postop Assessment: no apparent nausea or vomiting Anesthetic complications: no   No notable events documented.   Last Vitals:  Vitals:   06/05/23 1137 06/05/23 1142  BP: 110/70 127/72  Pulse: 69 72  Resp: 15 19  Temp: (!) 36.3 C 36.7 C  SpO2: 96% 95%    Last Pain:  Vitals:   06/05/23 1142  TempSrc:   PainSc: 0-No pain                 Travion Ke C Tramya Schoenfelder

## 2023-06-25 DIAGNOSIS — R208 Other disturbances of skin sensation: Secondary | ICD-10-CM | POA: Diagnosis not present

## 2023-06-25 DIAGNOSIS — D2262 Melanocytic nevi of left upper limb, including shoulder: Secondary | ICD-10-CM | POA: Diagnosis not present

## 2023-06-25 DIAGNOSIS — L538 Other specified erythematous conditions: Secondary | ICD-10-CM | POA: Diagnosis not present

## 2023-06-25 DIAGNOSIS — D2272 Melanocytic nevi of left lower limb, including hip: Secondary | ICD-10-CM | POA: Diagnosis not present

## 2023-06-25 DIAGNOSIS — L2989 Other pruritus: Secondary | ICD-10-CM | POA: Diagnosis not present

## 2023-06-25 DIAGNOSIS — L570 Actinic keratosis: Secondary | ICD-10-CM | POA: Diagnosis not present

## 2023-06-25 DIAGNOSIS — D2261 Melanocytic nevi of right upper limb, including shoulder: Secondary | ICD-10-CM | POA: Diagnosis not present

## 2023-06-25 DIAGNOSIS — L820 Inflamed seborrheic keratosis: Secondary | ICD-10-CM | POA: Diagnosis not present

## 2023-06-25 DIAGNOSIS — D225 Melanocytic nevi of trunk: Secondary | ICD-10-CM | POA: Diagnosis not present

## 2023-06-25 DIAGNOSIS — D485 Neoplasm of uncertain behavior of skin: Secondary | ICD-10-CM | POA: Diagnosis not present

## 2023-07-05 DIAGNOSIS — H35352 Cystoid macular degeneration, left eye: Secondary | ICD-10-CM | POA: Diagnosis not present

## 2023-07-11 ENCOUNTER — Ambulatory Visit
Admission: RE | Admit: 2023-07-11 | Discharge: 2023-07-11 | Disposition: A | Discharge status: Home / Self Care | Source: Ambulatory Visit | Attending: Family Medicine | Admitting: Family Medicine

## 2023-07-11 ENCOUNTER — Ambulatory Visit
Admission: RE | Admit: 2023-07-11 | Discharge: 2023-07-11 | Disposition: A | Discharge status: Home / Self Care | Source: Ambulatory Visit | Attending: Family Medicine

## 2023-07-11 DIAGNOSIS — Z1231 Encounter for screening mammogram for malignant neoplasm of breast: Secondary | ICD-10-CM | POA: Insufficient documentation

## 2023-07-11 DIAGNOSIS — Z780 Asymptomatic menopausal state: Secondary | ICD-10-CM | POA: Insufficient documentation

## 2023-07-11 DIAGNOSIS — M8589 Other specified disorders of bone density and structure, multiple sites: Secondary | ICD-10-CM | POA: Insufficient documentation

## 2023-07-16 ENCOUNTER — Ambulatory Visit: Payer: Self-pay | Admitting: Family Medicine

## 2023-07-17 ENCOUNTER — Other Ambulatory Visit: Payer: Self-pay | Admitting: Family Medicine

## 2023-07-17 DIAGNOSIS — R928 Other abnormal and inconclusive findings on diagnostic imaging of breast: Secondary | ICD-10-CM

## 2023-07-23 ENCOUNTER — Ambulatory Visit
Admission: RE | Admit: 2023-07-23 | Discharge: 2023-07-23 | Disposition: A | Discharge status: Home / Self Care | Source: Ambulatory Visit | Attending: Family Medicine

## 2023-07-23 ENCOUNTER — Ambulatory Visit
Admission: RE | Admit: 2023-07-23 | Discharge: 2023-07-23 | Disposition: A | Discharge status: Home / Self Care | Source: Ambulatory Visit | Attending: Family Medicine | Admitting: Family Medicine

## 2023-07-23 DIAGNOSIS — R928 Other abnormal and inconclusive findings on diagnostic imaging of breast: Secondary | ICD-10-CM | POA: Insufficient documentation

## 2023-07-23 DIAGNOSIS — R92321 Mammographic fibroglandular density, right breast: Secondary | ICD-10-CM | POA: Diagnosis not present

## 2023-07-23 DIAGNOSIS — N6001 Solitary cyst of right breast: Secondary | ICD-10-CM | POA: Diagnosis not present

## 2023-07-23 DIAGNOSIS — N6311 Unspecified lump in the right breast, upper outer quadrant: Secondary | ICD-10-CM | POA: Diagnosis not present

## 2023-09-19 ENCOUNTER — Telehealth: Payer: Self-pay

## 2023-09-19 NOTE — Progress Notes (Signed)
 Pharmacy Quality Measure Review  This patient is appearing on a report for being at risk of failing the adherence measure for cholesterol (statin) medications this calendar year.   Medication: rosuvastatin  20 mg  Last fill date: 05/07/23 for 90 day supply  Patient endorses medication adherence. She states she has one more month of this medication at home. 90-day refill available at the pharmacy. No action needed at this time.  Mikeria Valin E. Marsh, PharmD Clinical Pharmacist [facility] [phone]

## 2023-09-24 ENCOUNTER — Encounter: Payer: PPO | Admitting: Family Medicine

## 2023-10-15 NOTE — Progress Notes (Signed)
 Pharmacy Quality Measure Review  This patient is appearing on a report for being at risk of failing the adherence measure for cholesterol (statin) medications this calendar year.   Medication: rosuvastatin  10 mg  Last fill date: 05/07/23 for 90 day supply  Contacted pharmacy to facilitate refills.  Abdelaziz Westenberger E. Marsh, PharmD Clinical Pharmacist [facility] [phone]

## 2023-10-18 ENCOUNTER — Encounter: Payer: Self-pay | Admitting: Family Medicine

## 2023-10-19 ENCOUNTER — Ambulatory Visit (INDEPENDENT_AMBULATORY_CARE_PROVIDER_SITE_OTHER): Admitting: Nurse Practitioner

## 2023-10-19 VITALS — BP 128/82 | HR 82 | Temp 97.9°F | Ht 62.0 in | Wt 184.6 lb

## 2023-10-19 DIAGNOSIS — B029 Zoster without complications: Secondary | ICD-10-CM

## 2023-10-19 MED ORDER — ACYCLOVIR 800 MG PO TABS: 800.0000 mg | ORAL_TABLET | Freq: Every day | ORAL | 0 refills | Status: AC

## 2023-10-19 NOTE — Telephone Encounter (Signed)
 noted

## 2023-10-19 NOTE — Progress Notes (Signed)
 Kim Glance, NP-C Phone: [phone]  Kim Ritter is a 68 y.o. female who presents today for rash.  Discussed the use of AI scribe software for clinical note transcription with the patient, who gave verbal consent to proceed.  History of Present Illness   Kim Ritter is a 68 year old female who presents with a rash on her hip.  The rash began approximately four days ago on her hip, initially appearing as a couple of bumps and has since progressed to four clustered bumps. It is intensely itchy, leading to scratching, and is painful and burning, especially when touched or when clothing rubs against it.  There is no spread of the rash or increase in size beyond the initial area. No tingling sensation is present, but there is a burning and painful sensation localized to the rash. No similar symptoms have appeared elsewhere on her body.  Her husband initially thought the rash might be bug bites, but a friend suggested it could be shingles. She is concerned about the possibility of shingles due to the appearance of the rash and her friend's suggestion.  She has not taken any medication for nerve pain and does not feel the need for it at this time. She inquires about the possibility of working.      Social History   Tobacco Use  Smoking Status Former   Current packs/day: 0.00   Average packs/day: 0.5 packs/day for 30.0 years (15.0 ttl pk-yrs)   Types: Cigarettes   Start date: 01/1988   Quit date: 01/2018   Years since quitting: 5.8  Smokeless Tobacco Never    Current Outpatient Medications on File Prior to Visit  Medication Sig Dispense Refill   amLODipine  (NORVASC ) 10 MG tablet TAKE 1 TABLET BY MOUTH EVERY DAY 90 tablet 1   ascorbic acid (VITAMIN C) 500 MG tablet      Biotin 10 MG TABS      CALCIUM  PO Take by mouth daily.     Cholecalciferol (VITAMIN D3) 50 MCG (2000 UT) TABS      famotidine (PEPCID) 10 MG tablet Take 10 mg by mouth 2 (two) times daily as needed  for heartburn or indigestion.     hydrochlorothiazide  (HYDRODIURIL ) 12.5 MG tablet TAKE 1 TABLET BY MOUTH EVERY DAY 90 tablet 1   rosuvastatin  (CRESTOR ) 10 MG tablet TAKE 1 TABLET BY MOUTH EVERY DAY 90 tablet 1   Zinc Sulfate 220 (50 Zn) MG TABS      No current facility-administered medications on file prior to visit.     ROS see history of present illness  Objective  Physical Exam Vitals:   10/19/23 1029  BP: 128/82  Pulse: 82  Temp: 97.9 F (36.6 C)  SpO2: 98%    BP Readings from Last 3 Encounters:  10/19/23 128/82  06/05/23 127/72  03/27/23 130/71   Wt Readings from Last 3 Encounters:  10/19/23 184 lb 9.6 oz (83.7 kg)  06/05/23 193 lb (87.5 kg)  03/27/23 187 lb 6.4 oz (85 kg)    Physical Exam Constitutional:      General: She is not in acute distress.    Appearance: Normal appearance.  HENT:     Head: Normocephalic.  Cardiovascular:     Rate and Rhythm: Normal rate and regular rhythm.     Heart sounds: Normal heart sounds.  Pulmonary:     Effort: Pulmonary effort is normal.     Breath sounds: Normal breath sounds.  Skin:    General: Skin  is warm and dry.     Findings: Rash present. Rash is vesicular.         Comments: Blistered rash noted on upper right buttock/hip  Neurological:     General: No focal deficit present.     Mental Status: She is alert.  Psychiatric:        Mood and Affect: Mood normal.        Behavior: Behavior normal.      Assessment/Plan: Please see individual problem list.  Herpes zoster without complication Assessment & Plan: Symptoms and presentation is consistent with shingles. Prescribe acyclovir  five times daily for seven days. Advise her to keep the area covered to prevent contagion, especially around vulnerable groups. Provide an educational printout on shingles. Instruct her to report any worsening pain or rash changes over the weekend. Discuss postherpetic neuralgia and advise reporting severe nerve pain. 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   Orders: -     Acyclovir ; Take 1 tablet (800 mg total) by mouth 5 (five) times daily for 7 days.  Dispense: 35 tablet; Refill: 0     Return if symptoms worsen or fail to improve.   Kim Glance, NP-C [facility]

## 2023-11-05 NOTE — Assessment & Plan Note (Addendum)
 Symptoms and presentation is consistent with shingles. Prescribe acyclovir  five times daily for seven days. Advise her to keep the area covered to prevent contagion, especially around vulnerable groups. Provide an educational printout on shingles. Instruct her to report any worsening pain or rash changes over the weekend. Discuss postherpetic neuralgia and advise reporting severe nerve pain. 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

## 2024-01-07 DIAGNOSIS — Z961 Presence of intraocular lens: Secondary | ICD-10-CM | POA: Diagnosis not present

## 2024-01-07 DIAGNOSIS — M3501 Sicca syndrome with keratoconjunctivitis: Secondary | ICD-10-CM | POA: Diagnosis not present

## 2024-01-13 ENCOUNTER — Other Ambulatory Visit: Payer: Self-pay | Admitting: Family Medicine

## 2024-01-18 ENCOUNTER — Telehealth: Payer: Self-pay

## 2024-01-18 NOTE — Progress Notes (Signed)
 Pharmacy Quality Measure Review  This patient is appearing on a report for being at risk of failing the adherence measure for cholesterol (statin) medications this calendar year.   Medication: rosuvastatin  Last fill date: 01/14/24 for 90 day supply  Insurance report was not up to date. No action needed at this time.   Ranbir Chew E. Marsh, PharmD, CPP Clinical Pharmacist [facility] [phone]

## 2024-03-12 ENCOUNTER — Other Ambulatory Visit: Payer: Self-pay | Admitting: Family Medicine

## 2024-03-12 DIAGNOSIS — Z1231 Encounter for screening mammogram for malignant neoplasm of breast: Secondary | ICD-10-CM

## 2024-04-01 ENCOUNTER — Ambulatory Visit: Payer: PPO

## 2024-04-07 ENCOUNTER — Other Ambulatory Visit

## 2024-07-11 ENCOUNTER — Encounter
